# Patient Record
Sex: Female | Born: 1970 | Race: White | Hispanic: No | Marital: Single | Smoking: Light tobacco smoker
Health system: Southern US, Community
[De-identification: ages and names within clinical notes are randomized; demographics above are authoritative.]

## PROBLEM LIST (undated history)

## (undated) DIAGNOSIS — M1990 Unspecified osteoarthritis, unspecified site: Secondary | ICD-10-CM

## (undated) DIAGNOSIS — Z87898 Personal history of other specified conditions: Secondary | ICD-10-CM

## (undated) DIAGNOSIS — D569 Thalassemia, unspecified: Secondary | ICD-10-CM

## (undated) HISTORY — DX: Unspecified osteoarthritis, unspecified site: M19.90

## (undated) HISTORY — DX: Thalassemia, unspecified: D56.9

## (undated) HISTORY — DX: Personal history of other specified conditions: Z87.898

---

## 2007-08-18 ENCOUNTER — Emergency Department (HOSPITAL_COMMUNITY): Admission: EM | Admit: 2007-08-18 | Discharge: 2007-08-18 | Payer: Self-pay | Admitting: Emergency Medicine

## 2008-06-12 ENCOUNTER — Encounter (INDEPENDENT_AMBULATORY_CARE_PROVIDER_SITE_OTHER): Payer: Self-pay | Admitting: *Deleted

## 2008-07-07 ENCOUNTER — Ambulatory Visit: Payer: Self-pay | Admitting: Internal Medicine

## 2008-07-07 DIAGNOSIS — R519 Headache, unspecified: Secondary | ICD-10-CM | POA: Insufficient documentation

## 2008-07-07 DIAGNOSIS — R51 Headache: Secondary | ICD-10-CM

## 2008-07-10 ENCOUNTER — Telehealth (INDEPENDENT_AMBULATORY_CARE_PROVIDER_SITE_OTHER): Payer: Self-pay | Admitting: *Deleted

## 2008-07-10 LAB — CONVERTED CEMR LAB
BUN: 13 mg/dL (ref 6–23)
Basophils Relative: 0.1 % (ref 0.0–3.0)
Chloride: 107 meq/L (ref 96–112)
Creatinine, Ser: 0.6 mg/dL (ref 0.4–1.2)
Eosinophils Absolute: 0.2 10*3/uL (ref 0.0–0.7)
Eosinophils Relative: 2.5 % (ref 0.0–5.0)
GFR calc non Af Amer: 120 mL/min
Glucose, Bld: 73 mg/dL (ref 70–99)
HCT: 38.6 % (ref 36.0–46.0)
MCV: 64.8 fL — ABNORMAL LOW (ref 78.0–100.0)
Monocytes Absolute: 0.5 10*3/uL (ref 0.1–1.0)
Monocytes Relative: 7.9 % (ref 3.0–12.0)
Neutrophils Relative %: 66.7 % (ref 43.0–77.0)
RBC: 5.95 M/uL — ABNORMAL HIGH (ref 3.87–5.11)
TSH: 1.46 microintl units/mL (ref 0.35–5.50)
WBC: 6.5 10*3/uL (ref 4.5–10.5)

## 2009-10-09 ENCOUNTER — Ambulatory Visit: Payer: Self-pay | Admitting: Internal Medicine

## 2009-10-09 DIAGNOSIS — M1990 Unspecified osteoarthritis, unspecified site: Secondary | ICD-10-CM

## 2009-10-09 LAB — CONVERTED CEMR LAB
Eosinophils Absolute: 0.2 10*3/uL (ref 0.0–0.7)
HCT: 38.5 % (ref 36.0–46.0)
Lymphs Abs: 1.3 10*3/uL (ref 0.7–4.0)
MCHC: 30.8 g/dL (ref 30.0–36.0)
MCV: 64.5 fL — ABNORMAL LOW (ref 78.0–100.0)
Monocytes Absolute: 0.4 10*3/uL (ref 0.1–1.0)
Neutrophils Relative %: 71.3 % (ref 43.0–77.0)
Platelets: 275 10*3/uL (ref 150.0–400.0)
RDW: 17 % — ABNORMAL HIGH (ref 11.5–14.6)
TSH: 1.37 microintl units/mL (ref 0.35–5.50)

## 2009-10-15 ENCOUNTER — Encounter (INDEPENDENT_AMBULATORY_CARE_PROVIDER_SITE_OTHER): Payer: Self-pay | Admitting: *Deleted

## 2009-10-15 LAB — CONVERTED CEMR LAB
ALT: 13 units/L (ref 0–35)
AST: 12 units/L (ref 0–37)
CO2: 29 meq/L (ref 19–32)
Chloride: 108 meq/L (ref 96–112)
Cholesterol: 156 mg/dL (ref 0–200)
Creatinine, Ser: 0.7 mg/dL (ref 0.4–1.2)
HDL: 75 mg/dL (ref >39.00)
Potassium: 4.1 meq/L (ref 3.5–5.1)
Triglycerides: 114 mg/dL (ref 0.0–149.0)
VLDL: 22.8 mg/dL (ref 0.0–40.0)

## 2009-10-24 ENCOUNTER — Ambulatory Visit: Payer: Self-pay | Admitting: Internal Medicine

## 2009-10-26 ENCOUNTER — Encounter (INDEPENDENT_AMBULATORY_CARE_PROVIDER_SITE_OTHER): Payer: Self-pay | Admitting: *Deleted

## 2009-10-26 LAB — CONVERTED CEMR LAB: Fecal Occult Bld: NEGATIVE

## 2009-10-29 ENCOUNTER — Encounter: Admission: RE | Admit: 2009-10-29 | Discharge: 2010-01-27 | Payer: Self-pay | Admitting: Internal Medicine

## 2009-11-21 ENCOUNTER — Encounter: Payer: Self-pay | Admitting: Internal Medicine

## 2010-04-05 ENCOUNTER — Encounter: Payer: Self-pay | Admitting: Internal Medicine

## 2010-10-08 NOTE — Miscellaneous (Signed)
Summary: PT Discharge/[facility]  PT Discharge/[facility]   Imported By: Lanelle Bal 11/30/2009 13:49:44  _____________________________________________________________________  External Attachment:    Type:   Image     Comment:   External Document

## 2010-10-08 NOTE — Letter (Signed)
Summary: Proof of Exam Form/[facility]  Proof of Exam Form/[facility]   Imported By: Lanelle Bal 04/12/2010 09:14:12  _____________________________________________________________________  External Attachment:    Type:   Image     Comment:   External Document

## 2010-10-08 NOTE — Letter (Signed)
Summary: [facility]: Immunoassay Fecal Occult Blood (iFOB) Order Form  [facility] [place]  [address] [place], [place] [address]   Phone: [phone]  Fax: [phone]      [facility]: Immunoassay Fecal Occult Blood (iFOB) Order Form   October 15, 2009 MRN: [id number]   WESTLYNN FIFER 12-29-1970   Physicican Name:_______paz________________  Diagnosis Code:_________285.9_________________      Shary Decamp

## 2010-10-08 NOTE — Letter (Signed)
Summary: Results Follow up Letter  [facility] [place]  [address] [place], [place] [address]   Phone: [phone]  Fax: [phone]    10/26/2009 MRN: [id number]  Jessica Patrick [address] [place], [place]  [address]  Dear Ms. Picha,  The following are the results of your recent test(s):   Hemoccult:        Normal __x___  Not normal _______ Comments:    _______stool test was negative for blood ______________________________________________________________ Other Tests:    We routinely do not discuss normal results over the telephone.  If you desire a copy of the results, or you have any questions about this information we can discuss them at your next office visit.   Sincerely,

## 2010-10-08 NOTE — Assessment & Plan Note (Signed)
Summary: CHECK UP//PH   Vital Signs:  Patient profile:   40 year old female Height:      64.5 inches Weight:      294 pounds BMI:     49.87 Pulse rate:   78 / minute BP sitting:   120 / 82  Vitals Entered By: Dena Billet CC: CPX Comments PT. complains of left knee pain x when walking allergy/sinus symptoms   History of Present Illness: CPX here w/ mom  complains of left knee pain anteriorly  x 89mo when walking swelling--no redness, warm--no  mild allergy/sinus symptoms   Allergies: No Known Drug Allergies  Past History:  Past Medical History: G0 Headache  Past Surgical History: Reviewed history from 07/07/2008 and no changes required. none  Family History: Reviewed history from 07/07/2008 and no changes required. Diabetes- mother cholesterol-mother  Prostate CA 1st degree relative <50-father Hypertension-mother,father colon ca--no breast ca--GM (?)  Social History: Reviewed history from 07/07/2008 and no changes required. 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   Neck:  no masses and no thyromegaly.   Lungs:  normal respiratory effort, no intercostal retractions, no accessory muscle use, and normal breath sounds.   Heart:  normal rate, regular rhythm, and no murmur.   Abdomen:  soft, non-tender, no distention, and no masses.   Extremities:  no pretibial edema bilaterally  ++ varocse veins w/o evidence of complication  KNEES:  R normal  L  knee slightly  puffy anteriorly, otherwise normal  Psych:  Oriented X3, memory intact for recent and remote, not anxious appearing, and not depressed appearing.     Impression & Recommendations:  Problem # 1:  HEALTH SCREENING (ICD-V70.0) discussed diet and exercise once knee pain better  Td 2009 had a  flu shot   diet-exercise discussed  never had a PAP or MMG, was refered in 2009 but did not go--STRONGLY REC TO GO, RE-REFERAL   Orders: Venipuncture (40981) TLB-BMP (Basic Metabolic Panel-BMET) (80048-METABOL) TLB-ALT (SGPT) (84460-ALT) TLB-AST (SGOT) (84450-SGOT) TLB-Lipid Panel (80061-LIPID) TLB-CBC Platelet - w/Differential (85025-CBCD) TLB-TSH (Thyroid Stimulating Hormone) (19147-WGN) Gynecologic Referral (Gyn)  Problem # 2:  KNEE PAIN (ICD-719.46)  see instructions  PT referal , close to her house   Orders: Physical Therapy Referral (PT)  Patient Instructions: 1)  Take 400-600 mg of Ibuprofen (Advil, Motrin) with food every  6 hours as needed  for relief of pain , stop meds if you have stomach pain  2)  Please schedule a follow-up appointment in 1 year.    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

## 2011-12-24 ENCOUNTER — Ambulatory Visit (INDEPENDENT_AMBULATORY_CARE_PROVIDER_SITE_OTHER): Payer: Managed Care, Other (non HMO) | Admitting: Internal Medicine

## 2011-12-24 ENCOUNTER — Encounter: Payer: Self-pay | Admitting: Internal Medicine

## 2011-12-24 DIAGNOSIS — M25569 Pain in unspecified knee: Secondary | ICD-10-CM

## 2011-12-24 DIAGNOSIS — Z0000 Encounter for general adult medical examination without abnormal findings: Secondary | ICD-10-CM

## 2011-12-24 NOTE — Assessment & Plan Note (Signed)
Left knee and hip pain. Knee pain or radiculopathy? Refer to ortho

## 2011-12-24 NOTE — Assessment & Plan Note (Addendum)
Td 2009 Unable to exercise due to pain Diet has improved significantly. Recommend to stay in that direction She has been recommended Pap smears and mammograms in the past, has been reluctant, states that she eventually did have a Pap smear, no documentation. She also states that she had mammogram, no documentation Plan: Refer to gynecology Refer for mammogram--------------> mom states she will schedule it  Labs

## 2011-12-24 NOTE — Progress Notes (Signed)
  Subjective:    Patient ID: Jessica Patrick, female    DOB: Sep 10, 1970, 41 y.o.   MRN: [id number]  HPI CPX Here  with her mother. Reports a much better diet in the last few months, has lost 20 pounds. She continue with left knee pain, the pain actually radiates up to the left hip, described as severe at times and worse when she tries to get up from a chair. Denies any bladder or bowel incontinence, no tingling in the lower extremities and no actual axial pain. She takes it sporadically Tylenol or over-the-counter Aleve.  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  Respiratory: Negative for cough and shortness of breath.   Cardiovascular: Negative for chest pain and leg swelling.  Gastrointestinal: Negative for abdominal pain, blood in stool and abdominal distention.  Genitourinary: Negative for hematuria and difficulty urinating.   periods are regular, last 3 days, described as very crampy     Objective:   Physical Exam General -- alert, well-developed, and overweight appearing ;NAD  Neck --no LADs, no thyromegaly  Lungs -- normal respiratory effort, no intercostal retractions, no accessory muscle use, and normal breath sounds.   Heart-- normal rate, regular rhythm, no murmur, and no gallop.   Abdomen--declined to get on the table d/t knee pain. Unable to do an abd exam  Extremities--  +/+++ pretibial edema bilaterally, symmetric; knees are without deformities. Musculoskeletal-- no tender to palpation in the lower back. Neurological exam--gait antalgic due to  knee and hip pain, motor symmetric. Unable to test for  DTRs    Assessment & Plan:

## 2011-12-24 NOTE — Patient Instructions (Addendum)
Come back fasting: FLP CBC, CMP, TSH.---- dx V70 ---- We are referring you to a  gynecologist and for a mammogram. Is extremely important to keep the appointments.

## 2011-12-25 ENCOUNTER — Other Ambulatory Visit (INDEPENDENT_AMBULATORY_CARE_PROVIDER_SITE_OTHER): Payer: Managed Care, Other (non HMO)

## 2011-12-25 DIAGNOSIS — D649 Anemia, unspecified: Secondary | ICD-10-CM

## 2011-12-25 DIAGNOSIS — Z0000 Encounter for general adult medical examination without abnormal findings: Secondary | ICD-10-CM

## 2011-12-25 LAB — COMPREHENSIVE METABOLIC PANEL
ALT: 11 U/L (ref 0–35)
AST: 14 U/L (ref 0–37)
Albumin: 4.1 g/dL (ref 3.5–5.2)
BUN: 16 mg/dL (ref 6–23)
CO2: 26 mEq/L (ref 19–32)
Calcium: 9 mg/dL (ref 8.4–10.5)
Chloride: 108 mEq/L (ref 96–112)
Creatinine, Ser: 0.7 mg/dL (ref 0.4–1.2)
GFR: 96.56 mL/min (ref >60.00)
Potassium: 4.1 mEq/L (ref 3.5–5.1)

## 2011-12-25 LAB — LIPID PANEL
LDL Cholesterol: 63 mg/dL (ref 0–99)
Total CHOL/HDL Ratio: 2
VLDL: 15 mg/dL (ref 0.0–40.0)

## 2011-12-25 LAB — CBC WITH DIFFERENTIAL/PLATELET
Basophils Relative: 0.4 % (ref 0.0–3.0)
Hemoglobin: 11.8 g/dL — ABNORMAL LOW (ref 12.0–15.0)
Lymphocytes Relative: 20 % (ref 12.0–46.0)
Monocytes Relative: 6.5 % (ref 3.0–12.0)
Neutro Abs: 4.7 10*3/uL (ref 1.4–7.7)
Neutrophils Relative %: 70.5 % (ref 43.0–77.0)
RBC: 5.91 Mil/uL — ABNORMAL HIGH (ref 3.87–5.11)
WBC: 6.7 10*3/uL (ref 4.5–10.5)

## 2012-01-02 ENCOUNTER — Telehealth: Payer: Self-pay | Admitting: Hematology & Oncology

## 2012-01-02 NOTE — Telephone Encounter (Signed)
Left pt message to call and schedule appointment °

## 2012-01-13 ENCOUNTER — Other Ambulatory Visit: Payer: Self-pay | Admitting: Internal Medicine

## 2012-01-13 DIAGNOSIS — Z1231 Encounter for screening mammogram for malignant neoplasm of breast: Secondary | ICD-10-CM

## 2012-01-29 ENCOUNTER — Other Ambulatory Visit (HOSPITAL_BASED_OUTPATIENT_CLINIC_OR_DEPARTMENT_OTHER): Payer: Managed Care, Other (non HMO) | Admitting: Lab

## 2012-01-29 ENCOUNTER — Ambulatory Visit: Payer: Managed Care, Other (non HMO)

## 2012-01-29 ENCOUNTER — Ambulatory Visit (HOSPITAL_BASED_OUTPATIENT_CLINIC_OR_DEPARTMENT_OTHER): Payer: Managed Care, Other (non HMO) | Admitting: Hematology & Oncology

## 2012-01-29 VITALS — BP 127/72 | HR 98 | Temp 96.9°F | Ht 64.0 in | Wt 277.0 lb

## 2012-01-29 DIAGNOSIS — D649 Anemia, unspecified: Secondary | ICD-10-CM

## 2012-01-29 DIAGNOSIS — D509 Iron deficiency anemia, unspecified: Secondary | ICD-10-CM

## 2012-01-29 LAB — CBC WITH DIFFERENTIAL (CANCER CENTER ONLY)
Eosinophils Absolute: 0.2 10*3/uL (ref 0.0–0.5)
LYMPH%: 21.8 % (ref 14.0–48.0)
MCH: 20 pg — ABNORMAL LOW (ref 26.0–34.0)
MCV: 62 fL — ABNORMAL LOW (ref 81–101)
MONO%: 7 % (ref 0.0–13.0)
Platelets: 253 10*3/uL (ref 145–400)
RBC: 5.85 10*6/uL — ABNORMAL HIGH (ref 3.70–5.32)
RDW: 18 % — ABNORMAL HIGH (ref 11.1–15.7)

## 2012-01-29 LAB — CHCC SATELLITE - SMEAR

## 2012-01-29 NOTE — Progress Notes (Signed)
This office note has been dictated.

## 2012-01-30 NOTE — Progress Notes (Signed)
CC:   Willow Ora, MD Feliberto Gottron. Turner Daniels, M.D.  DIAGNOSIS:  Microcytic anemia.  HISTORY OF PRESENT ILLNESS:  Ms. Klostermann is a really nice 41 year old white female.  She lives in [place].  She lives with her mom.  She is originally from [place]. She has been down in the [place] area now for a good 7 years.  She is seen by Dr. Willow Ora.  She is going to have knee surgery for her left knee June 7.  She is going to be operated on by Dr. Gean Birchwood.  She had some lab work done recently.  She had a CBC showed which showed a white count 6.7, hemoglobin 11.8, hematocrit 38.5, platelet count is 214.  MCV was quite low at 65.  Going back to 2010, hemoglobin 12.3, hematocrit 38.6.  MCV was 65. She has a normal white cell differential.  She still has her monthly cycles.  She stated that they are not that heavy. She has never been pregnant.  She said there has been no bleeding otherwise.  She was told she had very low iron.  Unfortunately, I do not have any iron studies that were run on her.  She had a TSH was 1.72.  Her electrolytes all looked okay.  BUN and creatinine were 16 and 0.7. Calcium was 9.  Albumin was 4.1.  Again, she was kindly referred to the [facility] for evaluation of this microcytic anemia.  She denies any "cravings." She does not chew ice.  She is not a vegetarian.  She works over at [employer].  She has had no rashes.  She has had no weight loss or weight gain.  There has been no change in her medications.  PAST MEDICAL HISTORY: 1. Left knee arthritis. 2. Intermittent headaches.  ALLERGIES:  None.  CURRENT MEDICATIONS:  Advil as indicated.  SOCIAL HISTORY:  Negative for tobacco or alcohol use.  She has no obvious occupational exposures.  FAMILY HISTORY:  Remarkable as her mother had early stage colon cancer. Her father had early stage prostate cancer.  REVIEW OF SYSTEMS:  As stated in history of present illness.  No additional findings are  noted on 12 system review.  PHYSICAL EXAMINATION:  This is a moderately obese white female in no obvious distress.  Vital signs:  Temperature 96.9, pulse 90, respiratory rate 24, blood pressure 127/72.  Weight is 277.  Head and neck: Normocephalic, atraumatic skull.  There are no ocular or oral lesions. There are no palpable cervical or supraclavicular lymph nodes.  Lungs: Clear bilaterally.  Cardiac:  Regular rate and rhythm with a normal S1 and S2.  There are no murmurs, rubs or bruits.  Abdomen: Soft with good bowel sounds.  She is somewhat obese.  She has no fluid wave.  There is no guarding or rebound tenderness.  There is no palpable hepatosplenomegaly.  Extremities:  Some chronic 1+ nonpitting edema of the lower legs.  She has good range motion of her joints.  There is no joint erythema, warmth or swelling.  Skin: No rashes, ecchymosis or petechia.  Neurological:  No focal neurological deficits.  LABORATORY STUDIES:  White cell count is 6.3, hemoglobin 11.7, hematocrit 36.3 and platelet count 253.  MCV is 62.  RDW is 18.  Her blood smear shows mild anisocytosis.  She does have some target cells.  There are some microcytic red cells.  She has some hypochromic red cells.  There are no nucleated red blood cells. I  see no teardrop cells.  There are no spherocytes.  White cells appear normal in morphology and maturation.  She has no hypersegmented polys.  There are no atypical lymphocytes.  Platelets are adequate in number and size.  IMPRESSION:  Ms. Capps is a very nice 41 year old white female with microcytic anemia.  In reality, she is really not even anemic.  I highly suspect that she has thalassemia.  Her mother has thalassemia.  Her mother's parents had thalassemia.  I suspect that she may also have some degree of iron deficiency. However, I think the majority of the low MCV is related to thalassemia.  She is going to have upcoming knee surgery.  This is arthroscopic.   I want to make sure that we get her blood optimized before surgery.  I will call her with results of the iron.  If she is iron deficient, then we will give her IV iron, which should be adequate to get her through surgery.  I do not see a need for a bone marrow biopsy.  I do not see any indication for any hematologic malignancy.  I will likely plan for Ms Lunz to come back to see [place] in about 2 months or so.  I want to make sure that when she comes back to see [place] she will be able to get here with her knee improving.  I spent a good hour and a half with Ms. Capitano.  She and her mom are both very, very nice.    ______________________________ Josph Macho, M.D. PRE/MEDQ  D:  01/29/2012  T:  01/30/2012  Job:  [id number]

## 2012-02-03 LAB — ERYTHROPOIETIN: Erythropoietin: 19.7 m[IU]/mL (ref 2.6–34.0)

## 2012-02-03 LAB — COMPREHENSIVE METABOLIC PANEL
ALT: 9 U/L (ref 0–35)
BUN: 18 mg/dL (ref 6–23)
CO2: 28 mEq/L (ref 19–32)
Creatinine, Ser: 0.69 mg/dL (ref 0.50–1.10)
Glucose, Bld: 78 mg/dL (ref 70–99)
Total Bilirubin: 0.8 mg/dL (ref 0.3–1.2)

## 2012-02-03 LAB — HEMOGLOBINOPATHY EVALUATION
Hgb F Quant: 0 % (ref 0.0–2.0)
Hgb S Quant: 0 %

## 2012-02-03 LAB — RETICULOCYTES (CHCC)
ABS Retic: 47.5 10*3/uL (ref 19.0–186.0)
Retic Ct Pct: 0.8 % (ref 0.4–2.3)

## 2012-02-03 LAB — IRON AND TIBC: %SAT: 13 % — ABNORMAL LOW (ref 20–55)

## 2012-02-07 HISTORY — PX: KNEE SURGERY: SHX244

## 2012-02-25 ENCOUNTER — Ambulatory Visit
Admission: RE | Admit: 2012-02-25 | Discharge: 2012-02-25 | Disposition: A | Discharge status: Home / Self Care | Payer: Managed Care, Other (non HMO) | Source: Ambulatory Visit | Attending: Internal Medicine | Admitting: Internal Medicine

## 2012-02-25 DIAGNOSIS — Z1231 Encounter for screening mammogram for malignant neoplasm of breast: Secondary | ICD-10-CM

## 2012-03-04 ENCOUNTER — Other Ambulatory Visit: Payer: Self-pay | Admitting: Hematology & Oncology

## 2012-03-04 ENCOUNTER — Telehealth: Payer: Self-pay | Admitting: Hematology & Oncology

## 2012-03-04 DIAGNOSIS — D509 Iron deficiency anemia, unspecified: Secondary | ICD-10-CM

## 2012-03-04 NOTE — Telephone Encounter (Signed)
Mailed august schedule °

## 2012-05-03 ENCOUNTER — Other Ambulatory Visit: Payer: Managed Care, Other (non HMO) | Admitting: Lab

## 2012-05-03 ENCOUNTER — Ambulatory Visit: Payer: Managed Care, Other (non HMO) | Admitting: Hematology & Oncology

## 2012-05-03 ENCOUNTER — Telehealth: Payer: Self-pay | Admitting: Hematology & Oncology

## 2012-05-03 NOTE — Telephone Encounter (Signed)
Per Mother pt canceled 8-26 appointment on the phone tree. She said patient would call back to reschedule

## 2012-12-29 ENCOUNTER — Encounter: Payer: Managed Care, Other (non HMO) | Admitting: Internal Medicine

## 2013-01-05 ENCOUNTER — Encounter: Payer: Self-pay | Admitting: Internal Medicine

## 2013-01-05 ENCOUNTER — Ambulatory Visit (INDEPENDENT_AMBULATORY_CARE_PROVIDER_SITE_OTHER): Payer: Managed Care, Other (non HMO) | Admitting: Internal Medicine

## 2013-01-05 VITALS — BP 126/82 | HR 78 | Ht 63.5 in | Wt 258.0 lb

## 2013-01-05 DIAGNOSIS — M25562 Pain in left knee: Secondary | ICD-10-CM

## 2013-01-05 DIAGNOSIS — Z0000 Encounter for general adult medical examination without abnormal findings: Secondary | ICD-10-CM

## 2013-01-05 LAB — COMPREHENSIVE METABOLIC PANEL
BUN: 22 mg/dL (ref 6–23)
CO2: 29 mEq/L (ref 19–32)
Calcium: 9.4 mg/dL (ref 8.4–10.5)
Chloride: 104 mEq/L (ref 96–112)
Creatinine, Ser: 0.7 mg/dL (ref 0.4–1.2)
GFR: 104.52 mL/min (ref >60.00)
Glucose, Bld: 95 mg/dL (ref 70–99)
Total Bilirubin: 1.2 mg/dL (ref 0.3–1.2)

## 2013-01-05 LAB — CBC WITH DIFFERENTIAL/PLATELET
Basophils Absolute: 0 10*3/uL (ref 0.0–0.1)
Eosinophils Relative: 2 % (ref 0.0–5.0)
HCT: 40.1 % (ref 36.0–46.0)
Hemoglobin: 12.9 g/dL (ref 12.0–15.0)
Lymphocytes Relative: 20.3 % (ref 12.0–46.0)
Lymphs Abs: 1.5 10*3/uL (ref 0.7–4.0)
Monocytes Relative: 6.7 % (ref 3.0–12.0)
Neutro Abs: 5.3 10*3/uL (ref 1.4–7.7)
WBC: 7.4 10*3/uL (ref 4.5–10.5)

## 2013-01-05 LAB — LIPID PANEL
Cholesterol: 166 mg/dL (ref 0–200)
HDL: 87.7 mg/dL (ref >39.00)
LDL Cholesterol: 65 mg/dL (ref 0–99)
VLDL: 13.4 mg/dL (ref 0.0–40.0)

## 2013-01-05 LAB — TSH: TSH: 0.87 u[IU]/mL (ref 0.35–5.50)

## 2013-01-05 NOTE — Patient Instructions (Addendum)
Please see your female doctor yearly. You are due for a mammogram next month, please arrange your appointment. Next visit with me  one year. If you continue with pain and swelling, please see the orthopedic doctor.

## 2013-01-05 NOTE — Progress Notes (Signed)
  Subjective:    Patient ID: Jessica Patrick, female    DOB: [birth year], 42 y.o.   MRN: [id number]  HPI CPX   Past Medical History  Diagnosis Date  . H/O headache   . Thalassemia      s/p eval hematology 01-2012)   Past Surgical History  Procedure Laterality Date  . 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 Denies chest pain or shortness or breath. No nausea, vomiting, diarrhea or blood in the stools. Continue with L leg pain and swelling. on as needed NSAIDs. No anxiety or depression.    Objective:   Physical Exam BP 126/82  Pulse 78  Ht 5' 3.5" (1.613 m)  Wt 258 lb (117.028 kg)  BMI 44.98 kg/m2  SpO2 99% \ General -- alert, well-developed, has lost weight ! Neck --no thyromegaly Lungs -- normal respiratory effort, no intercostal retractions, no accessory muscle use, and normal breath sounds.   Heart-- normal rate, regular rhythm, no murmur, and no gallop.   Abdomen--unable to exam, declined to get in the examining table   Extremities-- ++/+++ pitting edema below L knee (since surgery 2013)  Psych-- Cognition and judgment appear intact. Alert and cooperative with normal attention span and concentration.  not anxious appearing and not depressed appearing.       Assessment & Plan:

## 2013-01-05 NOTE — Assessment & Plan Note (Signed)
Td 2009 Unable to exercise due to pain Diet has improved significantly, she continue loosing weight, praised! Pt and her mother report she had a female exam last year, no documentation. rec to see gyn yearly 01-2012 mammograms (-) Labs

## 2013-01-05 NOTE — Assessment & Plan Note (Signed)
On going swelling, pain R leg, take NSAIDs, rec to see ortho

## 2013-01-06 ENCOUNTER — Encounter: Payer: Self-pay | Admitting: *Deleted

## 2013-01-17 ENCOUNTER — Other Ambulatory Visit: Payer: Self-pay | Admitting: Internal Medicine

## 2013-01-17 DIAGNOSIS — Z1231 Encounter for screening mammogram for malignant neoplasm of breast: Secondary | ICD-10-CM

## 2013-03-02 ENCOUNTER — Ambulatory Visit
Admission: RE | Admit: 2013-03-02 | Discharge: 2013-03-02 | Disposition: A | Discharge status: Home / Self Care | Payer: Managed Care, Other (non HMO) | Source: Ambulatory Visit | Attending: Internal Medicine | Admitting: Internal Medicine

## 2013-03-02 DIAGNOSIS — Z1231 Encounter for screening mammogram for malignant neoplasm of breast: Secondary | ICD-10-CM

## 2014-01-10 ENCOUNTER — Telehealth: Payer: Self-pay

## 2014-01-10 NOTE — Telephone Encounter (Signed)
Medication and allergies:  Reviewed and updated  90 day supply/mail order: na Local pharmacy: [facility]   Immunizations due:  Flu in the fall  A/P:   No changes to FH, PSH or Personal Hx Flu vaccine-- Tdap--2009 Pap--no records though states had in 2013 MMG--02/2013--neg bi rads  To Discuss with Provider: Not at this time Will have hip replacement surgery soon

## 2014-01-11 ENCOUNTER — Encounter: Payer: Self-pay | Admitting: Internal Medicine

## 2014-01-11 ENCOUNTER — Ambulatory Visit (INDEPENDENT_AMBULATORY_CARE_PROVIDER_SITE_OTHER): Payer: Managed Care, Other (non HMO) | Admitting: Internal Medicine

## 2014-01-11 VITALS — BP 109/70 | HR 75 | Temp 97.8°F | Ht 63.5 in | Wt 248.0 lb

## 2014-01-11 DIAGNOSIS — M1990 Unspecified osteoarthritis, unspecified site: Secondary | ICD-10-CM

## 2014-01-11 DIAGNOSIS — Z0000 Encounter for general adult medical examination without abnormal findings: Secondary | ICD-10-CM

## 2014-01-11 LAB — CBC WITH DIFFERENTIAL/PLATELET
BASOS ABS: 0 10*3/uL (ref 0.0–0.1)
Basophils Relative: 0 % (ref 0.0–3.0)
EOS ABS: 0.2 10*3/uL (ref 0.0–0.7)
Eosinophils Relative: 2 % (ref 0.0–5.0)
HEMATOCRIT: 37.5 % (ref 36.0–46.0)
Hemoglobin: 11.7 g/dL — ABNORMAL LOW (ref 12.0–15.0)
LYMPHS ABS: 1.3 10*3/uL (ref 0.7–4.0)
Lymphocytes Relative: 14.9 % (ref 12.0–46.0)
MCHC: 31.3 g/dL (ref 30.0–36.0)
MCV: 65.5 fl — ABNORMAL LOW (ref 78.0–100.0)
MONOS PCT: 6 % (ref 3.0–12.0)
Monocytes Absolute: 0.5 10*3/uL (ref 0.1–1.0)
Neutro Abs: 6.7 10*3/uL (ref 1.4–7.7)
Neutrophils Relative %: 77.1 % — ABNORMAL HIGH (ref 43.0–77.0)
PLATELETS: 233 10*3/uL (ref 150.0–400.0)
RBC: 5.72 Mil/uL — ABNORMAL HIGH (ref 3.87–5.11)
RDW: 16.9 % — AB (ref 11.5–15.5)
WBC: 8.7 10*3/uL (ref 4.0–10.5)

## 2014-01-11 LAB — LIPID PANEL
CHOL/HDL RATIO: 2
Cholesterol: 150 mg/dL (ref 0–200)
HDL: 79.5 mg/dL (ref >39.00)
LDL Cholesterol: 56 mg/dL (ref 0–99)
Triglycerides: 72 mg/dL (ref 0.0–149.0)
VLDL: 14.4 mg/dL (ref 0.0–40.0)

## 2014-01-11 LAB — TSH: TSH: 0.73 u[IU]/mL (ref 0.35–4.50)

## 2014-01-11 LAB — COMPREHENSIVE METABOLIC PANEL
ALT: 12 U/L (ref 0–35)
AST: 13 U/L (ref 0–37)
Albumin: 4.1 g/dL (ref 3.5–5.2)
Alkaline Phosphatase: 53 U/L (ref 39–117)
BILIRUBIN TOTAL: 1 mg/dL (ref 0.2–1.2)
BUN: 20 mg/dL (ref 6–23)
CALCIUM: 9.5 mg/dL (ref 8.4–10.5)
CHLORIDE: 106 meq/L (ref 96–112)
CO2: 27 meq/L (ref 19–32)
Creatinine, Ser: 0.7 mg/dL (ref 0.4–1.2)
GFR: 100.49 mL/min (ref >60.00)
Glucose, Bld: 81 mg/dL (ref 70–99)
Potassium: 3.8 mEq/L (ref 3.5–5.1)
SODIUM: 140 meq/L (ref 135–145)
TOTAL PROTEIN: 6.6 g/dL (ref 6.0–8.3)

## 2014-01-11 NOTE — Assessment & Plan Note (Signed)
Severe left hip DJD, in need of surgery. Unable to do at EKG today because she was unable to get on the exam  table Cardiovascular exam normal except for low extremity edema which could be from obesity-inactivity. She snores but the Epworth scale  is only 2 which is negative screening for sleep apnea. Further management per ortho

## 2014-01-11 NOTE — Progress Notes (Signed)
   Subjective:    Patient ID: Jessica BellowDana Searfoss, female    DOB: 07/11/1971, 43 y.o.   MRN: [id number]  DOS:  01/11/2014 Type of  visit:  CPX, here w/ her mother   ROS Diet-- improved Exercise-- limited  No  CP, SOB No palpitations, + lower extremity edema B, symmetric, at baseline No orthopnea , DOE Denies  nausea, vomiting diarrhea Denies  blood in the stools (-) cough, sputum production (-) wheezing, chest congestion No dysuria, gross hematuria, difficulty urinating  No anxiety, depression    Past Medical History  Diagnosis Date  . H/O headache   . Thalassemia      s/p eval hematology 01-2012)  . DJD (degenerative joint disease)     Past Surgical History  Procedure Laterality Date  . Knee surgery Left 6-13    knee scope    History   Social History  . Marital Status: Single    Spouse Name: N/A    Number of Children: 0  . 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 Smoking status: Light Tobacco Smoker  . Smokeless tobacco: Never Used  . Alcohol Use: Yes     Comment: socially   . Drug Use: No  . Sexual Activity: Not on file   Other Topics Concern  . Not on file   Social History Narrative   Lives w/ parents, has a high school diploma     Family History  Problem Relation Age of Onset  . Diabetes Mother   . Hypertension Mother   . Hypertension Father   . Hyperlipidemia Mother   . Colon cancer Mother [id number]  . Breast cancer Neg Hx   . CAD Neg Hx        Medication List       This list is accurate as of: 01/11/14  6:17 PM.  Always use your most recent med list.               FOLIC ACID PO  Take 1 tablet by mouth daily.     MELOXICAM PO  Take 1 tablet by mouth daily.     MULTIPLE VITAMIN PO  Take by mouth every morning.           Objective:   Physical Exam BP 109/70  Pulse 75  Temp(Src) 97.8 F (36.6 C)  Ht 5' 3.5" (1.613 m)  Wt 248 lb (112.492 kg)  BMI 43.24 kg/m2  SpO2 98% Unable to  get on the exam table, exam done w/ pt sitting General -- alert, well-developed, NAD.  Neck --no thyromegaly  HEENT-- Not pale.   Lungs -- normal respiratory effort, no intercostal retractions, no accessory muscle use, and normal breath sounds.  Heart-- normal rate, regular rhythm, no murmur.  Abdomen-- Not distended, non-tender.  Extremities-- +/+++  pretibial edema bilaterally symmetric; gait limited by pain.  Neurologic--  alert & oriented X3. Speech normal, strength normal in all extremities.   Psych-- Cognition and judgment appear intact. Cooperative with normal attention span and concentration. No anxious or depressed appearing.          Assessment & Plan:

## 2014-01-11 NOTE — Progress Notes (Signed)
Pre visit review using our clinic review tool, if applicable. No additional management support is needed unless otherwise documented below in the visit note. 

## 2014-01-11 NOTE — Patient Instructions (Signed)
Get your blood work before you leave   See your gynecologist yearly  Next visit is for a physical exam in 1 year, fasting Please make an appointment

## 2014-01-11 NOTE — Assessment & Plan Note (Addendum)
Td 2009 Unable to exercise due to pain Diet has improved   loosing weight in preparation to hip surgery Pt and her mother report she had a female exam ~ 2013,  no documentation -----> rec to see gyn yearly 02-2013  (-) mammograms   Labs

## 2014-01-12 ENCOUNTER — Telehealth: Payer: Self-pay | Admitting: Internal Medicine

## 2014-01-12 NOTE — Telephone Encounter (Signed)
Relevant patient education assigned to patient using Emmi. ° °

## 2014-01-13 ENCOUNTER — Encounter: Payer: Self-pay | Admitting: *Deleted

## 2014-02-02 ENCOUNTER — Telehealth: Payer: Self-pay | Admitting: *Deleted

## 2014-02-02 NOTE — Telephone Encounter (Signed)
Patient walked into office and dropped off Physician Result Form for her job/insurance. Formed filled out and placed in folder for Dr. Drue Novel. JG//CMA

## 2014-02-03 NOTE — Telephone Encounter (Signed)
Received completed and signed form from Dr. Drue Novel.  Form faxed to [facility] @ [phone]).  Confirmation received.//AB/CMA

## 2014-03-28 ENCOUNTER — Telehealth: Payer: Self-pay

## 2014-03-28 ENCOUNTER — Other Ambulatory Visit: Payer: Self-pay | Admitting: Orthopedic Surgery

## 2014-03-28 NOTE — Telephone Encounter (Signed)
Pt's family member called back and stated that she had a scheduling conflict and will not be able to make the appointment at 11:30 on 04/10/14.  Spoke with Dr. Drue NovelPaz and he ok'd opening 4:15 slot on 04/10/14 and stated to schedule patient for the 4 pm appointment.  Appointment scheduled.

## 2014-03-28 NOTE — Telephone Encounter (Signed)
Pt scheduled for surgery on August 5.  Surgical clearance appointment scheduled for 04/10/14 @ 11:30 am.

## 2014-03-30 ENCOUNTER — Encounter (HOSPITAL_COMMUNITY): Payer: Self-pay | Admitting: Pharmacy Technician

## 2014-04-06 ENCOUNTER — Encounter (HOSPITAL_COMMUNITY)
Admission: RE | Admit: 2014-04-06 | Discharge: 2014-04-06 | Disposition: A | Discharge status: Home / Self Care | Payer: Managed Care, Other (non HMO) | Source: Ambulatory Visit | Attending: Orthopedic Surgery | Admitting: Orthopedic Surgery

## 2014-04-06 DIAGNOSIS — Z01810 Encounter for preprocedural cardiovascular examination: Secondary | ICD-10-CM | POA: Insufficient documentation

## 2014-04-06 DIAGNOSIS — Z01812 Encounter for preprocedural laboratory examination: Secondary | ICD-10-CM | POA: Insufficient documentation

## 2014-04-06 DIAGNOSIS — Z01818 Encounter for other preprocedural examination: Secondary | ICD-10-CM | POA: Insufficient documentation

## 2014-04-06 LAB — CBC WITH DIFFERENTIAL/PLATELET
BASOS ABS: 0 10*3/uL (ref 0.0–0.1)
Basophils Relative: 0 % (ref 0–1)
EOS ABS: 0.2 10*3/uL (ref 0.0–0.7)
Eosinophils Relative: 3 % (ref 0–5)
HCT: 39.5 % (ref 36.0–46.0)
Hemoglobin: 12.5 g/dL (ref 12.0–15.0)
LYMPHS ABS: 1.4 10*3/uL (ref 0.7–4.0)
Lymphocytes Relative: 22 % (ref 12–46)
MCH: 20.6 pg — ABNORMAL LOW (ref 26.0–34.0)
MCHC: 31.6 g/dL (ref 30.0–36.0)
MCV: 65 fL — ABNORMAL LOW (ref 78.0–100.0)
MONOS PCT: 8 % (ref 3–12)
Monocytes Absolute: 0.5 10*3/uL (ref 0.1–1.0)
Neutro Abs: 4.3 10*3/uL (ref 1.7–7.7)
Neutrophils Relative %: 67 % (ref 43–77)
PLATELETS: 197 10*3/uL (ref 150–400)
RBC: 6.08 MIL/uL — ABNORMAL HIGH (ref 3.87–5.11)
RDW: 16.6 % — ABNORMAL HIGH (ref 11.5–15.5)
WBC: 6.4 10*3/uL (ref 4.0–10.5)

## 2014-04-06 LAB — URINALYSIS, ROUTINE W REFLEX MICROSCOPIC
Bilirubin Urine: NEGATIVE
GLUCOSE, UA: NEGATIVE mg/dL
Ketones, ur: NEGATIVE mg/dL
LEUKOCYTES UA: NEGATIVE
Nitrite: NEGATIVE
Protein, ur: NEGATIVE mg/dL
SPECIFIC GRAVITY, URINE: 1.022 (ref 1.005–1.030)
Urobilinogen, UA: 0.2 mg/dL (ref 0.0–1.0)
pH: 5.5 (ref 5.0–8.0)

## 2014-04-06 LAB — SURGICAL PCR SCREEN
MRSA, PCR: NEGATIVE
Staphylococcus aureus: NEGATIVE

## 2014-04-06 LAB — PROTIME-INR
INR: 1.04 (ref 0.00–1.49)
Prothrombin Time: 13.6 seconds (ref 11.6–15.2)

## 2014-04-06 LAB — TYPE AND SCREEN
ABO/RH(D): A POS
Antibody Screen: NEGATIVE

## 2014-04-06 LAB — BASIC METABOLIC PANEL
ANION GAP: 11 (ref 5–15)
BUN: 19 mg/dL (ref 6–23)
CALCIUM: 9.2 mg/dL (ref 8.4–10.5)
CO2: 26 mEq/L (ref 19–32)
Chloride: 105 mEq/L (ref 96–112)
Creatinine, Ser: 0.71 mg/dL (ref 0.50–1.10)
GFR calc Af Amer: >90 mL/min (ref >90)
GFR calc non Af Amer: >90 mL/min (ref >90)
GLUCOSE: 94 mg/dL (ref 70–99)
Potassium: 4.4 mEq/L (ref 3.7–5.3)
SODIUM: 142 meq/L (ref 137–147)

## 2014-04-06 LAB — URINE MICROSCOPIC-ADD ON

## 2014-04-06 LAB — ABO/RH: ABO/RH(D): A POS

## 2014-04-06 LAB — APTT: APTT: 29 s (ref 24–37)

## 2014-04-06 MED ORDER — CHLORHEXIDINE GLUCONATE 4 % EX LIQD: 60.0000 mL | Freq: Once | CUTANEOUS | Status: DC

## 2014-04-06 NOTE — Pre-Procedure Instructions (Signed)
Jessica Patrick  04/06/2014   Your procedure is scheduled on:  April 12, 2014  Report to [facility] Admitting at 12:45 PM.  Call this number if you have problems the morning of surgery: [phone]   Remember:   Do not eat food or drink liquids after midnight (August 4)   Take these medicines the morning of surgery with A SIP OF WATER: NONE   STOP MELOXICAM AS OF TODAY   Do not wear jewelry, make-up or nail polish.  Do not wear lotions, powders, or perfumes. You may wear deodorant.  Do not shave 48 hours prior to surgery.   Do not bring valuables to the hospital.  [facility] is not responsible for any belongings or valuables.               Contacts, dentures or bridgework may not be worn into surgery.  Leave suitcase in the car. After surgery it may be brought to your room.  For patients admitted to the hospital, discharge time is determined by your  treatment team.               Patients discharged the day of surgery will not be allowed to drive home.  Name and phone number of your driver:      Please read over the following fact sheets that you were given: Pain Booklet, Coughing and Deep Breathing, Blood Transfusion Information and Surgical Site Infection Prevention

## 2014-04-10 ENCOUNTER — Ambulatory Visit (INDEPENDENT_AMBULATORY_CARE_PROVIDER_SITE_OTHER): Payer: Managed Care, Other (non HMO) | Admitting: Internal Medicine

## 2014-04-10 ENCOUNTER — Encounter: Payer: Self-pay | Admitting: Internal Medicine

## 2014-04-10 ENCOUNTER — Ambulatory Visit: Payer: Managed Care, Other (non HMO) | Admitting: Internal Medicine

## 2014-04-10 ENCOUNTER — Telehealth: Payer: Self-pay | Admitting: *Deleted

## 2014-04-10 VITALS — BP 108/72 | HR 81 | Temp 97.9°F | Wt 244.0 lb

## 2014-04-10 DIAGNOSIS — M1611 Unilateral primary osteoarthritis, right hip: Secondary | ICD-10-CM

## 2014-04-10 DIAGNOSIS — M1610 Unilateral primary osteoarthritis, unspecified hip: Secondary | ICD-10-CM

## 2014-04-10 NOTE — Progress Notes (Signed)
Pre visit review using our clinic review tool, if applicable. No additional management support is needed unless otherwise documented below in the visit note. 

## 2014-04-10 NOTE — Patient Instructions (Signed)
You are clear for surgery, will fax the paperwork to your orthopedic surgery doctor

## 2014-04-10 NOTE — Progress Notes (Signed)
   Subjective:    Patient ID: Jessica Patrick, female    DOB: 05/29/1971, 43 y.o.   MRN: [id number]  DOS:  04/10/2014 Type of visit - description: Surgical clearance History: Patient in need of hip surgery, needs surgical clearance. besides the pain she feels well   ROS Denies chest pain, difficulty breathing, dyspnea on exertion, orthopnea. No nausea, vomiting, diarrhea or blood in the stools No dysuria, gross hematuria or difficulty urinating  Past Medical History  Diagnosis Date  . H/O headache   . Thalassemia      s/p eval hematology 01-2012)  . DJD (degenerative joint disease)     Past Surgical History  Procedure Laterality Date  . Knee surgery Left 6-13    knee scope    History   Social History  . Marital Status: Single    Spouse Name: N/A    Number of Children: 0  . 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 Smoking status: Light Tobacco Smoker  . Smokeless tobacco: Never Used  . Alcohol Use: Yes     Comment: socially   . Drug Use: No  . Sexual Activity: Not on file   Other Topics Concern  . Not on file   Social History Narrative   Lives w/ parents, has a high school diploma        Medication List       This list is accurate as of: 04/10/14 10:39 PM.  Always use your most recent med list.               FOLIC ACID PO  Take 1 tablet by mouth daily.     multivitamin capsule  Take 1 capsule by mouth daily.           Objective:   Physical Exam BP 108/72  Pulse 81  Temp(Src) 97.9 F (36.6 C)  Wt 244 lb (110.678 kg)  SpO2 97%  LMP 03/05/2014  General -- alert, well-developed, NAD.  Neck --Unable to check for JVD, patient is sitting in a chair HEENT-- Not pale.  Lungs -- normal respiratory effort, no intercostal retractions, no accessory muscle use, and normal breath sounds.  Heart-- normal rate, regular rhythm, no murmur.   Extremities-- trace symmetric pretibial edema bilaterally    Neurologic--  alert & oriented X3. Speech normal, gait difficult d/t pain, strength symmetric and appropriate for age.   psych-- Cognition and judgment appear intact. Cooperative with normal attention span and concentration. No anxious or depressed appearing.         Assessment & Plan:    Surgical clearance, Recent labs satisfactory, recent EKG negative. Epworth scale  Few weeks ago was negative for sleep apnea. Plan: The patient is cleared for surgery, will fax a note to Dr. Turner Danielsowan and cc this note

## 2014-04-10 NOTE — Assessment & Plan Note (Signed)
To have   elective hip surgery, she is clear for surgery

## 2014-04-10 NOTE — Telephone Encounter (Signed)
medical clearance form faxed [facility].

## 2014-04-11 MED ORDER — CEFAZOLIN SODIUM-DEXTROSE 2-3 GM-% IV SOLR
2.0000 g | INTRAVENOUS | Status: AC
  Administered 2014-04-12: 2 g via INTRAVENOUS
  Filled 2014-04-11: qty 50

## 2014-04-11 MED ORDER — DEXTROSE-NACL 5-0.45 % IV SOLN: INTRAVENOUS | Status: DC

## 2014-04-11 MED ORDER — TRANEXAMIC ACID 100 MG/ML IV SOLN
1000.0000 mg | INTRAVENOUS | Status: AC
  Administered 2014-04-12: 1000 mg via INTRAVENOUS
  Filled 2014-04-11: qty 10

## 2014-04-11 NOTE — H&P (Signed)
TOTAL HIP ADMISSION H&P  Patient is admitted for left total hip arthroplasty.  Subjective:  Chief Complaint: left hip pain  HPI: Jessica Patrick, 43 y.o. female, has a history of pain and functional disability in the left hip(s) due to arthritis and patient has failed non-surgical conservative treatments for greater than 12 weeks to include NSAID's and/or analgesics, use of assistive devices, weight reduction as appropriate and activity modification.  Onset of symptoms was gradual starting several years ago with gradually worsening course since that time.The patient noted no past surgery on the left hip(s).  Patient currently rates pain in the left hip at 10 out of 10 with activity. Patient has night pain, worsening of pain with activity and weight bearing, pain that interfers with activities of daily living, pain with passive range of motion and crepitus. Patient has evidence of periarticular osteophytes and joint space narrowing by imaging studies. This condition presents safety issues increasing the risk of falls. There is no current active infection.  Patient Active Problem List   Diagnosis Date Noted  . Annual physical exam 12/24/2011  . DJD (degenerative joint disease) 10/09/2009  . HEADACHE 07/07/2008   Past Medical History  Diagnosis Date  . H/O headache   . Thalassemia      s/p eval hematology 01-2012)  . DJD (degenerative joint disease)     Past Surgical History  Procedure Laterality Date  . Knee surgery Left 6-13    knee scope    No prescriptions prior to admission   Allergies  Allergen Reactions  . Lactose Intolerance (Gi)     History  Substance Use Topics  . Smoking status: Light Tobacco Smoker  . Smokeless tobacco: Never Used  . Alcohol Use: Yes     Comment: socially     Family History  Problem Relation Age of Onset  . Diabetes Mother   . Hypertension Mother   . Hypertension Father   . Hyperlipidemia Mother   . Colon cancer Mother [id number]  . Breast cancer Neg Hx    . CAD Neg Hx      Review of Systems  Constitutional: Negative.   HENT: Negative.   Eyes: Negative.   Respiratory: Negative.   Cardiovascular: Negative.   Gastrointestinal: Negative.   Genitourinary: Negative.   Musculoskeletal: Positive for joint pain.  Skin: Negative.   Neurological: Negative.   Endo/Heme/Allergies: Negative.   Psychiatric/Behavioral: Negative.     Objective:  Physical Exam  Constitutional: She is oriented to person, place, and time. She appears well-developed and well-nourished.  HENT:  Head: Normocephalic and atraumatic.  Eyes: Pupils are equal, round, and reactive to light.  Neck: Normal range of motion. Neck supple.  Cardiovascular: Intact distal pulses.   Respiratory: Effort normal.  Musculoskeletal: She exhibits tenderness.  Patient continues to have obvious pain with range of motion of the left hip.  Severely limited internal and external rotation.  She is neurovascularly intact distally.  Neurological: She is alert and oriented to person, place, and time.  Skin: Skin is warm and dry.  Psychiatric: She has a normal mood and affect. Her behavior is normal. Judgment and thought content normal.    Vital signs in last 24 hours: Temp:  [97.9 F (36.6 C)] 97.9 F (36.6 C) (08/03 1602) Pulse Rate:  [81] 81 (08/03 1602) BP: (108)/(72) 108/72 mmHg (08/03 1602) SpO2:  [97 %] 97 % (08/03 1602) Weight:  [110.678 kg (244 lb)] 110.678 kg (244 lb) (08/03 1602)  Labs:   Estimated body mass index  is 43.24 kg/(m^2) as calculated from the following:   Height as of 01/11/14: 5' 3.5" (1.613 m).   Weight as of 01/11/14: 112.492 kg (248 lb).   Imaging Review Radiographs:  X-rays today included; AP of the pelvis and one view of the left hip are taken and reviewed in office today.  Patient has obvious end-stage arthritis of left hip with periarticular spurring and collapse of the femoral head.  Assessment/Plan:  End stage arthritis, left hip(s)  The patient  history, physical examination, clinical judgement of the provider and imaging studies are consistent with end stage degenerative joint disease of the left hip(s) and total hip arthroplasty is deemed medically necessary. The treatment options including medical management, injection therapy, arthroscopy and arthroplasty were discussed at length. The risks and benefits of total hip arthroplasty were presented and reviewed. The risks due to aseptic loosening, infection, stiffness, dislocation/subluxation,  thromboembolic complications and other imponderables were discussed.  The patient acknowledged the explanation, agreed to proceed with the plan and consent was signed. Patient is being admitted for inpatient treatment for surgery, pain control, PT, OT, prophylactic antibiotics, VTE prophylaxis, progressive ambulation and ADL's and discharge planning.The patient is planning to be discharged home with home health services

## 2014-04-12 ENCOUNTER — Inpatient Hospital Stay (HOSPITAL_COMMUNITY)
Admission: RE | Admit: 2014-04-12 | Discharge: 2014-04-15 | DRG: 470 | Disposition: A | Discharge status: Home Health | Payer: Managed Care, Other (non HMO) | Source: Ambulatory Visit | Attending: Orthopedic Surgery | Admitting: Orthopedic Surgery

## 2014-04-12 ENCOUNTER — Encounter (HOSPITAL_COMMUNITY): Payer: Managed Care, Other (non HMO) | Admitting: Anesthesiology

## 2014-04-12 ENCOUNTER — Inpatient Hospital Stay (HOSPITAL_COMMUNITY): Payer: Managed Care, Other (non HMO) | Admitting: Anesthesiology

## 2014-04-12 ENCOUNTER — Encounter (HOSPITAL_COMMUNITY): Payer: Self-pay | Admitting: *Deleted

## 2014-04-12 ENCOUNTER — Inpatient Hospital Stay (HOSPITAL_COMMUNITY): Payer: Managed Care, Other (non HMO)

## 2014-04-12 ENCOUNTER — Encounter (HOSPITAL_COMMUNITY)
Admission: RE | Disposition: A | Discharge status: Home Health | Payer: Self-pay | Source: Ambulatory Visit | Attending: Orthopedic Surgery

## 2014-04-12 DIAGNOSIS — F17200 Nicotine dependence, unspecified, uncomplicated: Secondary | ICD-10-CM | POA: Diagnosis present

## 2014-04-12 DIAGNOSIS — M169 Osteoarthritis of hip, unspecified: Principal | ICD-10-CM | POA: Diagnosis present

## 2014-04-12 DIAGNOSIS — M1612 Unilateral primary osteoarthritis, left hip: Secondary | ICD-10-CM | POA: Diagnosis present

## 2014-04-12 DIAGNOSIS — E669 Obesity, unspecified: Secondary | ICD-10-CM | POA: Diagnosis present

## 2014-04-12 DIAGNOSIS — M1610 Unilateral primary osteoarthritis, unspecified hip: Principal | ICD-10-CM | POA: Diagnosis present

## 2014-04-12 DIAGNOSIS — Z6841 Body Mass Index (BMI) 40.0 and over, adult: Secondary | ICD-10-CM

## 2014-04-12 HISTORY — PX: TOTAL HIP ARTHROPLASTY: SHX124

## 2014-04-12 LAB — HCG, SERUM, QUALITATIVE: Preg, Serum: NEGATIVE

## 2014-04-12 SURGERY — ARTHROPLASTY, HIP, TOTAL,POSTERIOR APPROACH
Anesthesia: General | Site: Hip | Laterality: Left

## 2014-04-12 MED ORDER — GLYCOPYRROLATE 0.2 MG/ML IJ SOLN
INTRAMUSCULAR | Status: AC
  Filled 2014-04-12: qty 3

## 2014-04-12 MED ORDER — ASPIRIN EC 325 MG PO TBEC
325.0000 mg | DELAYED_RELEASE_TABLET | Freq: Every day | ORAL | Status: DC
  Administered 2014-04-14 – 2014-04-15 (×2): 325 mg via ORAL
  Filled 2014-04-12 (×5): qty 1

## 2014-04-12 MED ORDER — FLEET ENEMA 7-19 GM/118ML RE ENEM
1.0000 | ENEMA | Freq: Once | RECTAL | Status: AC | PRN
  Filled 2014-04-12: qty 1

## 2014-04-12 MED ORDER — TRANEXAMIC ACID 100 MG/ML IV SOLN
1000.0000 mg | INTRAVENOUS | Status: DC
  Filled 2014-04-12 (×2): qty 10

## 2014-04-12 MED ORDER — BUPIVACAINE-EPINEPHRINE (PF) 0.5% -1:200000 IJ SOLN
INTRAMUSCULAR | Status: AC
  Filled 2014-04-12: qty 30

## 2014-04-12 MED ORDER — GLYCOPYRROLATE 0.2 MG/ML IJ SOLN
INTRAMUSCULAR | Status: DC | PRN
  Administered 2014-04-12: .7 mg via INTRAVENOUS

## 2014-04-12 MED ORDER — MIDAZOLAM HCL 5 MG/5ML IJ SOLN
INTRAMUSCULAR | Status: DC | PRN
  Administered 2014-04-12: 2 mg via INTRAVENOUS

## 2014-04-12 MED ORDER — FENTANYL CITRATE 0.05 MG/ML IJ SOLN
INTRAMUSCULAR | Status: AC
  Filled 2014-04-12: qty 2

## 2014-04-12 MED ORDER — DIPHENHYDRAMINE HCL 12.5 MG/5ML PO ELIX: 12.5000 mg | ORAL_SOLUTION | ORAL | Status: DC | PRN

## 2014-04-12 MED ORDER — ROCURONIUM BROMIDE 50 MG/5ML IV SOLN
INTRAVENOUS | Status: AC
  Filled 2014-04-12: qty 1

## 2014-04-12 MED ORDER — DOCUSATE SODIUM 100 MG PO CAPS
100.0000 mg | ORAL_CAPSULE | Freq: Two times a day (BID) | ORAL | Status: DC
  Administered 2014-04-14: 100 mg via ORAL
  Filled 2014-04-12 (×3): qty 1

## 2014-04-12 MED ORDER — PHENOL 1.4 % MT LIQD: 1.0000 | OROMUCOSAL | Status: DC | PRN

## 2014-04-12 MED ORDER — LIDOCAINE HCL (CARDIAC) 20 MG/ML IV SOLN
INTRAVENOUS | Status: DC | PRN
  Administered 2014-04-12: 75 mg via INTRAVENOUS

## 2014-04-12 MED ORDER — KCL IN DEXTROSE-NACL 20-5-0.45 MEQ/L-%-% IV SOLN
INTRAVENOUS | Status: DC
  Administered 2014-04-13 (×2): via INTRAVENOUS
  Filled 2014-04-12 (×8): qty 1000

## 2014-04-12 MED ORDER — FENTANYL CITRATE 0.05 MG/ML IJ SOLN
INTRAMUSCULAR | Status: AC
  Filled 2014-04-12: qty 5

## 2014-04-12 MED ORDER — ACETAMINOPHEN 650 MG RE SUPP: 650.0000 mg | Freq: Four times a day (QID) | RECTAL | Status: DC | PRN

## 2014-04-12 MED ORDER — ONDANSETRON HCL 4 MG/2ML IJ SOLN: 4.0000 mg | Freq: Once | INTRAMUSCULAR | Status: DC | PRN

## 2014-04-12 MED ORDER — MIDAZOLAM HCL 2 MG/2ML IJ SOLN
INTRAMUSCULAR | Status: AC
  Filled 2014-04-12: qty 2

## 2014-04-12 MED ORDER — BUPIVACAINE-EPINEPHRINE 0.5% -1:200000 IJ SOLN
INTRAMUSCULAR | Status: DC | PRN
  Administered 2014-04-12: 10 mL

## 2014-04-12 MED ORDER — ALUMINUM HYDROXIDE GEL 320 MG/5ML PO SUSP
15.0000 mL | ORAL | Status: DC | PRN
  Filled 2014-04-12: qty 30

## 2014-04-12 MED ORDER — OXYCODONE HCL 5 MG/5ML PO SOLN: 5.0000 mg | Freq: Once | ORAL | Status: AC | PRN

## 2014-04-12 MED ORDER — MENTHOL 3 MG MT LOZG
1.0000 | LOZENGE | OROMUCOSAL | Status: DC | PRN
Start: 2014-04-12 — End: 2014-04-15

## 2014-04-12 MED ORDER — OXYCODONE HCL 5 MG PO TABS
5.0000 mg | ORAL_TABLET | ORAL | Status: DC | PRN
  Administered 2014-04-12: 5 mg via ORAL
  Administered 2014-04-14: 10 mg via ORAL
  Filled 2014-04-12: qty 2

## 2014-04-12 MED ORDER — METOCLOPRAMIDE HCL 5 MG/ML IJ SOLN: 5.0000 mg | Freq: Three times a day (TID) | INTRAMUSCULAR | Status: DC | PRN

## 2014-04-12 MED ORDER — NEOSTIGMINE METHYLSULFATE 10 MG/10ML IV SOLN
INTRAVENOUS | Status: DC | PRN
  Administered 2014-04-12: 3.5 mg via INTRAVENOUS

## 2014-04-12 MED ORDER — LACTATED RINGERS IV SOLN
INTRAVENOUS | Status: DC | PRN
  Administered 2014-04-12 (×2): via INTRAVENOUS

## 2014-04-12 MED ORDER — OXYCODONE-ACETAMINOPHEN 5-325 MG PO TABS: 1.0000 | ORAL_TABLET | ORAL | Status: AC | PRN

## 2014-04-12 MED ORDER — LIDOCAINE HCL (CARDIAC) 20 MG/ML IV SOLN
INTRAVENOUS | Status: AC
  Filled 2014-04-12: qty 5

## 2014-04-12 MED ORDER — ONDANSETRON HCL 4 MG/2ML IJ SOLN: 4.0000 mg | Freq: Four times a day (QID) | INTRAMUSCULAR | Status: DC | PRN

## 2014-04-12 MED ORDER — SENNOSIDES-DOCUSATE SODIUM 8.6-50 MG PO TABS: 1.0000 | ORAL_TABLET | Freq: Every evening | ORAL | Status: DC | PRN

## 2014-04-12 MED ORDER — ONDANSETRON HCL 4 MG PO TABS: 4.0000 mg | ORAL_TABLET | Freq: Four times a day (QID) | ORAL | Status: DC | PRN

## 2014-04-12 MED ORDER — ONDANSETRON HCL 4 MG/2ML IJ SOLN
INTRAMUSCULAR | Status: DC | PRN
  Administered 2014-04-12: 4 mg via INTRAVENOUS

## 2014-04-12 MED ORDER — METHOCARBAMOL 500 MG PO TABS
ORAL_TABLET | ORAL | Status: AC
Start: 2014-04-12 — End: 2014-04-13
  Filled 2014-04-12: qty 1

## 2014-04-12 MED ORDER — ACETAMINOPHEN 325 MG PO TABS
650.0000 mg | ORAL_TABLET | Freq: Four times a day (QID) | ORAL | Status: DC | PRN
  Administered 2014-04-13 – 2014-04-14 (×4): 650 mg via ORAL
  Filled 2014-04-12 (×4): qty 2

## 2014-04-12 MED ORDER — METOCLOPRAMIDE HCL 5 MG PO TABS: 5.0000 mg | ORAL_TABLET | Freq: Three times a day (TID) | ORAL | Status: DC | PRN

## 2014-04-12 MED ORDER — PROPOFOL 10 MG/ML IV BOLUS
INTRAVENOUS | Status: DC | PRN
  Administered 2014-04-12: 160 mg via INTRAVENOUS

## 2014-04-12 MED ORDER — ONDANSETRON HCL 4 MG/2ML IJ SOLN
INTRAMUSCULAR | Status: AC
  Filled 2014-04-12: qty 2

## 2014-04-12 MED ORDER — METHOCARBAMOL 1000 MG/10ML IJ SOLN
500.0000 mg | Freq: Four times a day (QID) | INTRAVENOUS | Status: DC | PRN
  Filled 2014-04-12: qty 5

## 2014-04-12 MED ORDER — ROCURONIUM BROMIDE 100 MG/10ML IV SOLN
INTRAVENOUS | Status: DC | PRN
  Administered 2014-04-12: 50 mg via INTRAVENOUS

## 2014-04-12 MED ORDER — METHOCARBAMOL 500 MG PO TABS: 500.0000 mg | ORAL_TABLET | Freq: Two times a day (BID) | ORAL | Status: AC

## 2014-04-12 MED ORDER — NEOSTIGMINE METHYLSULFATE 10 MG/10ML IV SOLN
INTRAVENOUS | Status: AC
  Filled 2014-04-12: qty 1

## 2014-04-12 MED ORDER — FENTANYL CITRATE 0.05 MG/ML IJ SOLN
25.0000 ug | INTRAMUSCULAR | Status: DC | PRN
  Administered 2014-04-12 (×2): 50 ug via INTRAVENOUS

## 2014-04-12 MED ORDER — FENTANYL CITRATE 0.05 MG/ML IJ SOLN
INTRAMUSCULAR | Status: DC | PRN
  Administered 2014-04-12: 50 ug via INTRAVENOUS
  Administered 2014-04-12: 100 ug via INTRAVENOUS
  Administered 2014-04-12: 150 ug via INTRAVENOUS
  Administered 2014-04-12: 50 ug via INTRAVENOUS

## 2014-04-12 MED ORDER — LACTATED RINGERS IV SOLN
INTRAVENOUS | Status: DC
Start: 2014-04-12 — End: 2014-04-12
  Administered 2014-04-12: 13:00:00 via INTRAVENOUS

## 2014-04-12 MED ORDER — BISACODYL 5 MG PO TBEC
5.0000 mg | DELAYED_RELEASE_TABLET | Freq: Every day | ORAL | Status: DC | PRN
  Administered 2014-04-14: 5 mg via ORAL
  Filled 2014-04-12: qty 1

## 2014-04-12 MED ORDER — SODIUM CHLORIDE 0.9 % IR SOLN
Status: DC | PRN
  Administered 2014-04-12: 1000 mL

## 2014-04-12 MED ORDER — ASPIRIN EC 325 MG PO TBEC: 325.0000 mg | DELAYED_RELEASE_TABLET | Freq: Two times a day (BID) | ORAL | Status: AC

## 2014-04-12 MED ORDER — HYDROMORPHONE HCL PF 1 MG/ML IJ SOLN: 0.5000 mg | INTRAMUSCULAR | Status: DC | PRN

## 2014-04-12 MED ORDER — METHOCARBAMOL 500 MG PO TABS
500.0000 mg | ORAL_TABLET | Freq: Four times a day (QID) | ORAL | Status: DC | PRN
  Administered 2014-04-12: 500 mg via ORAL
  Filled 2014-04-12: qty 1

## 2014-04-12 MED ORDER — PROPOFOL 10 MG/ML IV BOLUS
INTRAVENOUS | Status: AC
  Filled 2014-04-12: qty 20

## 2014-04-12 MED ORDER — OXYCODONE HCL 5 MG PO TABS
ORAL_TABLET | ORAL | Status: AC
  Filled 2014-04-12: qty 2

## 2014-04-12 MED ORDER — OXYCODONE HCL 5 MG PO TABS
5.0000 mg | ORAL_TABLET | Freq: Once | ORAL | Status: AC | PRN
  Administered 2014-04-12: 5 mg via ORAL

## 2014-04-12 SURGICAL SUPPLY — 57 items
BLADE SAW SGTL 18X1.27X75 (BLADE) ×2 IMPLANT
BLADE SAW SGTL 18X1.27X75MM (BLADE) ×1
BRUSH FEMORAL CANAL (MISCELLANEOUS) IMPLANT
CAPT HIP PF COP ×3 IMPLANT
COVER BACK TABLE 24X17X13 BIG (DRAPES) IMPLANT
COVER SURGICAL LIGHT HANDLE (MISCELLANEOUS) ×6 IMPLANT
DRAPE ORTHO SPLIT 77X108 STRL (DRAPES) ×4
DRAPE PROXIMA HALF (DRAPES) ×3 IMPLANT
DRAPE SURG ORHT 6 SPLT 77X108 (DRAPES) ×2 IMPLANT
DRAPE U-SHAPE 47X51 STRL (DRAPES) ×3 IMPLANT
DRILL BIT 7/64X5 (BIT) ×3 IMPLANT
DRSG AQUACEL AG ADV 3.5X10 (GAUZE/BANDAGES/DRESSINGS) ×3 IMPLANT
DRSG AQUACEL AG ADV 3.5X14 (GAUZE/BANDAGES/DRESSINGS) ×3 IMPLANT
DURAPREP 26ML APPLICATOR (WOUND CARE) ×3 IMPLANT
ELECT BLADE 4.0 EZ CLEAN MEGAD (MISCELLANEOUS)
ELECT REM PT RETURN 9FT ADLT (ELECTROSURGICAL) ×3
ELECTRODE BLDE 4.0 EZ CLN MEGD (MISCELLANEOUS) IMPLANT
ELECTRODE REM PT RTRN 9FT ADLT (ELECTROSURGICAL) ×1 IMPLANT
GAUZE XEROFORM 1X8 LF (GAUZE/BANDAGES/DRESSINGS) ×3 IMPLANT
GLOVE BIO SURGEON STRL SZ7.5 (GLOVE) ×3 IMPLANT
GLOVE BIO SURGEON STRL SZ8.5 (GLOVE) ×6 IMPLANT
GLOVE BIOGEL PI IND STRL 7.0 (GLOVE) ×1 IMPLANT
GLOVE BIOGEL PI IND STRL 8 (GLOVE) ×2 IMPLANT
GLOVE BIOGEL PI IND STRL 9 (GLOVE) ×1 IMPLANT
GLOVE BIOGEL PI INDICATOR 7.0 (GLOVE) ×2
GLOVE BIOGEL PI INDICATOR 8 (GLOVE) ×4
GLOVE BIOGEL PI INDICATOR 9 (GLOVE) ×2
GOWN STRL REUS W/ TWL LRG LVL3 (GOWN DISPOSABLE) ×2 IMPLANT
GOWN STRL REUS W/ TWL XL LVL3 (GOWN DISPOSABLE) ×3 IMPLANT
GOWN STRL REUS W/TWL LRG LVL3 (GOWN DISPOSABLE) ×4
GOWN STRL REUS W/TWL XL LVL3 (GOWN DISPOSABLE) ×6
HANDPIECE INTERPULSE COAX TIP (DISPOSABLE)
HOOD PEEL AWAY FACE SHEILD DIS (HOOD) ×9 IMPLANT
KIT BASIN OR (CUSTOM PROCEDURE TRAY) ×3 IMPLANT
KIT ROOM TURNOVER OR (KITS) ×3 IMPLANT
MANIFOLD NEPTUNE II (INSTRUMENTS) ×3 IMPLANT
NEEDLE 22X1 1/2 (OR ONLY) (NEEDLE) ×3 IMPLANT
NS IRRIG 1000ML POUR BTL (IV SOLUTION) ×3 IMPLANT
PACK TOTAL JOINT (CUSTOM PROCEDURE TRAY) ×3 IMPLANT
PAD ARMBOARD 7.5X6 YLW CONV (MISCELLANEOUS) ×6 IMPLANT
PASSER SUT SWANSON 36MM LOOP (INSTRUMENTS) ×3 IMPLANT
PRESSURIZER FEMORAL UNIV (MISCELLANEOUS) IMPLANT
SET HNDPC FAN SPRY TIP SCT (DISPOSABLE) IMPLANT
SUT ETHIBOND 2 V 37 (SUTURE) ×3 IMPLANT
SUT VIC AB 0 CTB1 27 (SUTURE) ×3 IMPLANT
SUT VIC AB 0 CTXB 36 (SUTURE) ×3 IMPLANT
SUT VIC AB 1 CTX 36 (SUTURE) ×2
SUT VIC AB 1 CTX36XBRD ANBCTR (SUTURE) ×1 IMPLANT
SUT VIC AB 2-0 CTB1 (SUTURE) ×6 IMPLANT
SUT VIC AB 3-0 SH 27 (SUTURE) ×4
SUT VIC AB 3-0 SH 27X BRD (SUTURE) ×2 IMPLANT
SYR CONTROL 10ML LL (SYRINGE) ×3 IMPLANT
TOWEL OR 17X24 6PK STRL BLUE (TOWEL DISPOSABLE) ×3 IMPLANT
TOWEL OR 17X26 10 PK STRL BLUE (TOWEL DISPOSABLE) ×3 IMPLANT
TOWER CARTRIDGE SMART MIX (DISPOSABLE) IMPLANT
TRAY FOLEY CATH 14FR (SET/KITS/TRAYS/PACK) IMPLANT
WATER STERILE IRR 1000ML POUR (IV SOLUTION) ×12 IMPLANT

## 2014-04-12 NOTE — Anesthesia Procedure Notes (Signed)
Procedure Name: Intubation Date/Time: 04/12/2014 3:42 PM Performed by: Gwenyth AllegraADAMI, Wynn Alldredge Pre-anesthesia Checklist: Emergency Drugs available, Timeout performed, Patient identified, Patient being monitored and Suction available Patient Re-evaluated:Patient Re-evaluated prior to inductionOxygen Delivery Method: Circle system utilized Preoxygenation: Pre-oxygenation with 100% oxygen Intubation Type: IV induction Ventilation: Mask ventilation without difficulty and Oral airway inserted - appropriate to patient size Laryngoscope Size: Mac and 3 Grade View: Grade I Tube type: Oral Tube size: 7.0 mm Number of attempts: 1 Airway Equipment and Method: Stylet Placement Confirmation: ETT inserted through vocal cords under direct vision,  breath sounds checked- equal and bilateral and positive ETCO2 Secured at: 21 cm Tube secured with: Tape Dental Injury: Teeth and Oropharynx as per pre-operative assessment

## 2014-04-12 NOTE — Anesthesia Preprocedure Evaluation (Addendum)
Anesthesia Evaluation  Patient identified by MRN, date of birth, ID band Patient awake    Reviewed: Allergy & Precautions, H&P , NPO status , Patient's Chart, lab work & pertinent test results  Airway Mallampati: II TM Distance: >3 FB Neck ROM: Full    Dental  (+) Teeth Intact, Dental Advisory Given   Pulmonary Current Smoker,  breath sounds clear to auscultation        Cardiovascular Rhythm:Regular Rate:Normal     Neuro/Psych    GI/Hepatic   Endo/Other    Renal/GU      Musculoskeletal   Abdominal (+) + obese,   Peds  Hematology   Anesthesia Other Findings   Reproductive/Obstetrics                          Anesthesia Physical Anesthesia Plan  ASA: III  Anesthesia Plan: General   Post-op Pain Management:    Induction: Intravenous  Airway Management Planned: Oral ETT  Additional Equipment:   Intra-op Plan:   Post-operative Plan: Extubation in OR  Informed Consent: I have reviewed the patients History and Physical, chart, labs and discussed the procedure including the risks, benefits and alternatives for the proposed anesthesia with the patient or authorized representative who has indicated his/her understanding and acceptance.   Dental advisory given  Plan Discussed with: CRNA and Anesthesiologist  Anesthesia Plan Comments: (DJD L. Hip Obesity  Plan GA with oral ETT  Kipp Broodavid Ewin Rehberg)        Anesthesia Quick Evaluation

## 2014-04-12 NOTE — Interval H&P Note (Signed)
History and Physical Interval Note:  04/12/2014 1:35 PM  Jessica BellowDana Pineo  has presented today for surgery, with the diagnosis of LEFT HIP OSTEOARTHRITIS  The various methods of treatment have been discussed with the patient and family. After consideration of risks, benefits and other options for treatment, the patient has consented to  Procedure(s): TOTAL HIP ARTHROPLASTY (Left) as a surgical intervention .  The patient's history has been reviewed, patient examined, no change in status, stable for surgery.  I have reviewed the patient's chart and labs.  Questions were answered to the patient's satisfaction.     Nestor LewandowskyOWAN,March Joos J

## 2014-04-12 NOTE — Op Note (Signed)
OPERATIVE REPORT    DATE OF PROCEDURE:  04/12/2014       PREOPERATIVE DIAGNOSIS:  LEFT HIP OSTEOARTHRITIS                                                          POSTOPERATIVE DIAGNOSIS:  LEFT HIP OSTEOARTHRITIS                                                           PROCEDURE:  L total hip arthroplasty using a 52 mm DePuy Pinnacle  Cup, [employer], 10-degree polyethylene liner index superior  and posterior, a +3 36 mm ceramic head, a 18x13x36x160 SROM stem, 18Dsm Sleeve   SURGEON: Tyeshia Cornforth J    ASSISTANT:   Eric K. [employer]  (present throughout entire procedure and necessary for timely completion of the procedure)   ANESTHESIA: General BLOOD LOSS: 400 FLUID REPLACEMENT: 1900 crystalloid DRAINS: Foley Catheter URINE OUTPUT: 300cc COMPLICATIONS: none    INDICATIONS FOR PROCEDURE: A 43 y.o. year-old With  LEFT HIP OSTEOARTHRITIS   for 3 years, x-rays show bone-on-bone arthritic changes. Despite conservative measures with observation, anti-inflammatory medicine, narcotics, use of a cane, has severe unremitting pain and can ambulate only a few blocks before resting.  Patient desires elective L total hip arthroplasty to decrease pain and increase function. The risks, benefits, and alternatives were discussed at length including but not limited to the risks of infection, bleeding, nerve injury, stiffness, blood clots, the need for revision surgery, cardiopulmonary complications, among others, and they were willing to proceed. Questions answered     PROCEDURE IN DETAIL: The patient was identified by armband,  received preoperative IV antibiotics in the holding area at [facility] taken to the operating room , appropriate anesthetic monitors  were attached and general endotracheal anesthesia induced. Foley catheter was inserted. Pt was rolled into the R lateral decubitus position and fixed there with a Stulberg Mark II pelvic clamp.  The L lower extremity was then  prepped and draped  in the usual sterile fashion from the ankle to the hemipelvis. A time-out  procedure was performed. The skin along the lateral hip and thigh  infiltrated with 10 mL of 0.5% Marcaine and epinephrine solution. We  then made a posterolateral approach to the hip. With a #10 blade, a 24 cm  incision was made through the skin and subcutaneous tissue down to the level of the  IT band. Small bleeders were identified and cauterized. The IT band was cut in  line with skin incision exposing the greater trochanter. A Cobra retractor was placed between the gluteus minimus and the superior hip joint capsule, and a spiked Cobra between the quadratus femoris and the inferior hip joint capsule. This isolated the short  external rotators and piriformis tendons. These were tagged with a #2 Ethibond  suture and cut off their insertion on the intertrochanteric crest. The posterior  capsule was then developed into an acetabular-based flap from Posterior Superior off of the acetabulum out over the femoral neck and back posterior inferior to the acetabular rim. This flap was tagged with two #2 Ethibond  sutures and retracted protecting the sciatic nerve. This exposed the arthritic femoral head and osteophytes. The hip was then flexed and internally rotated, dislocating the femoral head and a standard neck cut performed 1 fingerbreadth above the lesser trochanter.  A spiked Cobra was placed in the cotyloid notch and a Hohmann retractor was then used to lever the femur anteriorly off of the anterior pelvic column. A posterior-inferior wing retractor was placed at the junction of the acetabulum and the ischium completing the acetabular exposure.We then removed the peripheral osteophytes and labrum from the acetabulum. We then reamed the acetabulum up to 51 mm with basket reamers obtaining good coverage in all quadrants. We then irrigated with normal  saline solution and hammered into place a 52 mm pinnacle cup  in 45  degrees of abduction and about 20 degrees of anteversion. More  peripheral osteophytes removed and a trial 10-degree liner placed with the  index superior-posterior. The hip was then flexed and internally rotated exposing the  proximal femur, which was entered with the initiating reamer followed by  the axial reamers up to a 13.5 mm full depth and 14mm partial depth. We then conically reamed to 18D to the correct depth for a 42 base neck. The calcar was milled to 18Dsm. A trial cone and stem was inserted in the 25 degrees anteversion, with a +0 36mm trial head. Trial reduction was then performed and excellent stability was noted with at 90 of flexion with 75 of internal rotation and then full extension with maximal external rotation. The hip could not be dislocated in full extension. The knee could easily flex  to about 130 degrees. We also stretched the abductors at this point,  because of the preexisting adductor contractures. All trial components  were then removed. The acetabulum was irrigated out with normal saline  solution. A titanium [facility] Eliminator was then screwed into place  followed by a 10-degree polyethylene liner index superior-posterior. On  the femoral side a 18Dsm ZTT1 sleeve was hammered into place, followed by a 18x13x36x160 SROM stem in 25 degrees of anteversion. At this point, a +0 36 mm ceramic head was  hammered on the stem. The hip was reduced. We checked our stability  one more time and found it to be excellent. The wound was once again  thoroughly irrigated out with normal saline solution pulse lavage. The  capsular flap and short external rotators were repaired back to the  intertrochanteric crest through drill holes with a #2 Ethibond suture.  The IT band was closed with running 1 Vicryl suture. The subcutaneous  tissue with 0 and 2-0 undyed Vicryl suture and the skin with running  interlocking 3-0 nylon suture. Dressing of Xeroform and Mepilex was  then  applied. The patient was then unclamped, rolled supine, awaken extubated and taken to recovery room without difficulty in stable condition.   Courtnie Brenes J 04/12/2014, 5:10 PM

## 2014-04-12 NOTE — Transfer of Care (Signed)
Immediate Anesthesia Transfer of Care Note  Patient: Regino BellowDana Sarafian  Procedure(s) Performed: Procedure(s): TOTAL HIP ARTHROPLASTY (Left)  Patient Location: PACU  Anesthesia Type:General  Level of Consciousness: awake  Airway & Oxygen Therapy: Patient Spontanous Breathing and Patient connected to face mask oxygen  Post-op Assessment: Report given to PACU RN and Post -op Vital signs reviewed and stable  Post vital signs: Reviewed and stable  Complications: No apparent anesthesia complications

## 2014-04-13 ENCOUNTER — Encounter (HOSPITAL_COMMUNITY): Payer: Self-pay | Admitting: Orthopedic Surgery

## 2014-04-13 LAB — BASIC METABOLIC PANEL
ANION GAP: 13 (ref 5–15)
BUN: 16 mg/dL (ref 6–23)
CHLORIDE: 104 meq/L (ref 96–112)
CO2: 25 meq/L (ref 19–32)
Calcium: 8.4 mg/dL (ref 8.4–10.5)
Creatinine, Ser: 0.72 mg/dL (ref 0.50–1.10)
GFR calc non Af Amer: >90 mL/min (ref >90)
Glucose, Bld: 132 mg/dL — ABNORMAL HIGH (ref 70–99)
POTASSIUM: 4.4 meq/L (ref 3.7–5.3)
SODIUM: 142 meq/L (ref 137–147)

## 2014-04-13 LAB — CBC
HCT: 30.1 % — ABNORMAL LOW (ref 36.0–46.0)
HEMOGLOBIN: 9.6 g/dL — AB (ref 12.0–15.0)
MCH: 20.2 pg — AB (ref 26.0–34.0)
MCHC: 31.9 g/dL (ref 30.0–36.0)
MCV: 63.4 fL — ABNORMAL LOW (ref 78.0–100.0)
Platelets: 202 10*3/uL (ref 150–400)
RBC: 4.75 MIL/uL (ref 3.87–5.11)
RDW: 16.1 % — ABNORMAL HIGH (ref 11.5–15.5)
WBC: 10.6 10*3/uL — ABNORMAL HIGH (ref 4.0–10.5)

## 2014-04-13 NOTE — Anesthesia Postprocedure Evaluation (Signed)
  Anesthesia Post-op Note  Patient: Jessica Patrick  Procedure(s) Performed: Procedure(s): TOTAL HIP ARTHROPLASTY (Left)  Patient Location: PACU  Anesthesia Type:General  Level of Consciousness: awake and alert   Airway and Oxygen Therapy: Patient Spontanous Breathing  Post-op Pain: mild  Post-op Assessment: Post-op Vital signs reviewed, Patient's Cardiovascular Status Stable and Respiratory Function Stable  Post-op Vital Signs: Reviewed  Filed Vitals:   04/13/14 1029  BP: 123/74  Pulse: 105  Temp: 37.1 C  Resp: 18    Complications: No apparent anesthesia complications

## 2014-04-13 NOTE — Progress Notes (Signed)
[facility]  Patient Status: New  [facility] is providing the following services: PT - referral from MD office.   If patient discharges after hours, please call [phone].   Jodene NamMary Hickling 04/13/2014, 5:10 PM

## 2014-04-13 NOTE — Progress Notes (Signed)
Clinical Social Work Department BRIEF PSYCHOSOCIAL ASSESSMENT 04/13/2014  Patient:  Regino BellowCOSTANZO,Nilda     Account Number:  [id number]     Admit date:  04/12/2014  Clinical Social Worker:  Cristy FolksBEST,Gerhart Ruggieri, LCSW  Date/Time:  04/13/2014 12:00 N  Referred by:  Physician  Date Referred:  04/12/2014 Referred for  SNF Placement   Other Referral:  N/A Interview type:  Patient Other interview type:    PSYCHOSOCIAL DATA Living Status:  PARENTS Admitted from facility:  N/A Level of care:  N/A Primary support name:  Janne LabVeronica Peplinski Primary support relationship to patient:  PARENT Degree of support available:   Good support    CURRENT CONCERNS  Other Concerns:  N/A  SOCIAL WORK ASSESSMENT / PLAN SW spoke with pt in regards to disposition. Pt reports that upon discharge that she will go home with her mother and [facility]. Pt reports that she works at [employer] in [place], where she stands the majority of the day. Pt reports that she hopes to be discharged soon. Pt reports that she is motivated to work with PT.   Assessment/plan status:  No Further Intervention Required Other assessment/ plan:   N/A   Information/referral to community resources:   N/A    PATIENT'S/FAMILY'S RESPONSE TO PLAN OF CARE: Pt appreciative of SW visit and is awaiting disposition.     Rosine Beatoris Kayton Dunaj,MSW [phone]

## 2014-04-13 NOTE — Progress Notes (Signed)
Physical Therapy Treatment Patient Details Name: Jessica Patrick MRN: [id number] DOB: 06-Mar-1971 Today's Date: 04/13/2014    History of Present Illness Jessica Patrick is a 43 y.o. Female s/p Lt THA on 04/12/14.  Pt with hx of DJD and a previous knee scope.     PT Comments    Pt continues to require incr time and max encouragement for therapy due to pain. Was able to increase ambulation distance today and performed pericare at [facility] with supervision (A) only. Pt demo gt abnormalities and difficulty swinging Lt LE through for gt. Pt continues to be adamant that she will D/C home with HHPT; discussed at length goals for rehab to safely D/C home. Pt encouraged OOB for meals. Pt continues to be limited in thera ex due to pain. Will cont to follow per POC.   Follow Up Recommendations  Home health PT;Supervision/Assistance - 24 hour;Other (comment)     Equipment Recommendations  Rolling walker with 5" wheels    Recommendations for Other Services       Precautions / Restrictions Precautions Precautions: Posterior Hip;Fall Precaution Booklet Issued: Yes (comment) Precaution Comments: pt unable to recall hip precautions as reviewed earlier session Restrictions Weight Bearing Restrictions: Yes LLE Weight Bearing: Weight bearing as tolerated    Mobility  Bed Mobility Overal bed mobility: Needs Assistance Bed Mobility: Supine to Sit;Sit to Supine     Supine to sit: Min assist;HOB elevated Sit to supine: Min assist   General bed mobility comments: progressing to min (A) with use of handrails; (A) to manage Lt LE and maintain hip precautions  Transfers Overall transfer level: Needs assistance Equipment used: Rolling walker (2 wheeled) Transfers: Sit to/from Stand Sit to Stand: Supervision Stand pivot transfers: Min assist       General transfer comment: supervision for safety when transferring from toilet; min guar when transferring from lower surface bed;cues for hand placement and  technique with RW  Ambulation/Gait Ambulation/Gait assistance: Supervision Ambulation Distance (Feet): 80 Feet Assistive device: Rolling walker (2 wheeled) Gait Pattern/deviations: Step-to pattern;Decreased weight shift to left;Decreased step length - left;Narrow base of support;Trunk flexed Gait velocity: very decr Gait velocity interpretation: Below normal speed for age/gender General Gait Details: pt with difficulty swinging Lt hip through during swing phase due to pain; max cues for upright posture and gt sequencing; very slow and stopped multiple times to stand and talk/rest   Stairs            Wheelchair Mobility    Modified Rankin (Stroke Patients Only)       Balance Overall balance assessment: Needs assistance Sitting-balance support: Feet supported;No upper extremity supported Sitting balance-Leahy Scale: Fair     Standing balance support: During functional activity;Bilateral upper extremity supported Standing balance-Leahy Scale: Poor Standing balance comment: was able to stand and perform pericare with single UE support and supervision                     Cognition Arousal/Alertness: Awake/alert Behavior During Therapy: Anxious Overall Cognitive Status: Within Functional Limits for tasks assessed       Memory: Decreased recall of precautions              Exercises Total Joint Exercises Ankle Circles/Pumps: AROM;PROM;Both;10 reps;Other (comment) Other Exercises Other Exercises: pt continues to be limited and refusing exercises due to pain    General Comments        Pertinent Vitals/Pain Pain Assessment: 0-10 Pain Score: 4  (reaches 10 with activity) Pain Location: surgical site  Pain Descriptors / Indicators: Aching;Constant;Operative site guarding Pain Intervention(s): Premedicated before session;Repositioned    Home Living Family/patient expects to be discharged to:: Private residence Living Arrangements: Parent Available Help  at Discharge: Family;Available 24 hours/day Type of Home: House Home Access: Stairs to enter Entrance Stairs-Rails: None Home Layout: Multi-level;Bed/bath upstairs Home Equipment: Cane - single point;Bedside commode;Shower seat;Hand held shower head      Prior Function Level of Independence: Independent with assistive device(s)      Comments: ambulated with cane secondary to pain   PT Goals (current goals can now be found in the care plan section) Acute Rehab PT Goals Patient Stated Goal: to get back to being me PT Goal Formulation: With patient Time For Goal Achievement: 04/17/14 Potential to Achieve Goals: Good Progress towards PT goals: Progressing toward goals    Frequency  7X/week    PT Plan Current plan remains appropriate    Co-evaluation             End of Session Equipment Utilized During Treatment: Gait belt Activity Tolerance: Patient limited by pain Patient left: in bed;with call bell/phone within reach     Time: [id number] PT Time Calculation (min): 31 min  Charges:  $Gait Training: 8-22 mins $Therapeutic Activity: 8-22 mins                    G CodesDonnamarie Poag:      Jessica Patrick [place], [place]  [phone] 04/13/2014, 5:00 PM

## 2014-04-13 NOTE — Progress Notes (Signed)
Occupational Therapy Evaluation Patient Details Name: Jessica Patrick Garbutt MRN: [id number] DOB: 01/27/1971 Today's Date: 04/13/2014    History of Present Illness Jessica Patrick Taul is a 43 y.o. Female s/p Lt THA on 04/12/14.  Pt with hx of DJD and a previous knee scope.    Clinical Impression   PTA pt lived at home with her parents and was independent with use of a cane for ADLs and functional mobility. Pt is limited by pain during today's OT session and became quickly fatigued getting from recliner to bed. Pt with difficulty during bed mobility and expressed frustration with mobility limitations. Pt would benefit from skilled OT for LB ADLs and functional mobility to increase independence. May need to consider SNF placement if pt does not make progress with mobility.     Follow Up Recommendations  Home health OT;Supervision/Assistance - 24 hour (may need to consider SNF if pt does not progress well)    Equipment Recommendations  None recommended by OT       Precautions / Restrictions Precautions Precautions: Posterior Hip;Fall Precaution Booklet Issued: Yes (comment) Precaution Comments: reviewed posterior hip precautions with pt Restrictions Weight Bearing Restrictions: Yes LLE Weight Bearing: Weight bearing as tolerated      Mobility Bed Mobility Overal bed mobility: Needs Assistance Bed Mobility: Sit to Supine       Sit to supine: Mod assist   General bed mobility comments: (A) for Bil LEs onto bed, increased time due to pain, max cues for sequencing  Transfers Overall transfer level: Needs assistance Equipment used: Rolling walker (2 wheeled) Transfers: Sit to/from [employer]GI CorporationStand;Stand Pivot Transfers Sit to Stand: Min assist Stand pivot transfers: Min assist       General transfer comment: (A) to maintain balance and manage RW: cues for posterior hip precautions          ADL Overall ADL's : Needs assistance/impaired Eating/Feeding: Independent;Sitting   Grooming: Set  up;Sitting   Upper Body Bathing: Set up;Sitting   Lower Body Bathing: Maximal assistance;Sit to/from stand   Upper Body Dressing : Set up;Sitting   Lower Body Dressing: Maximal assistance;Sit to/from stand   Toilet Transfer: Minimal assistance;Stand-pivot;RW;BSC   Toileting- Clothing Manipulation and Hygiene: Maximal assistance;Sit to/from stand   Tub/ Engineer, structuralhower Transfer: Total assistance   Functional mobility during ADLs:  (pt unable to complete functional mobility at this time) General ADL Comments: Pt is limited by pain and demonstrates irritability and frustration. Pt demonstrates some problem solving deficits and decreased memory and attention, however feel that this is pt's baseline. Pt was minimally cooperative in OT session. Mother provided home environment answers as pt provided sarcastic remarks in response to questions.      Vision  No apparent visual deficits.                    Perception Perception Perception Tested?: No   Praxis Praxis Praxis tested?: Within functional limits    Pertinent Vitals/Pain Pain Assessment: 0-10 Pain Score: 10-Worst pain ever Pain Location: Lt hip (surgical site) Pain Intervention(s): Limited activity within patient's tolerance;Repositioned;Patient requesting pain meds-RN notified;Ice applied        Extremity/Trunk Assessment Upper Extremity Assessment Upper Extremity Assessment: Generalized weakness   Lower Extremity Assessment Lower Extremity Assessment: Defer to PT evaluation   Cervical / Trunk Assessment Cervical / Trunk Assessment: Normal   Communication Communication Communication: No difficulties   Cognition Arousal/Alertness: Awake/alert Behavior During Therapy: Anxious Overall Cognitive Status: Within Functional Limits for tasks assessed (per pt's mother, pt is at baseline)  Memory: Decreased recall of precautions                        Home Living Family/patient expects to be discharged  to:: Private residence Living Arrangements: Parent Available Help at Discharge: Family;Available 24 hours/day Type of Home: House Home Access: Stairs to enter [employer] of Steps: 2 Entrance Stairs-Rails: None Home Layout: Multi-level;Bed/bath upstairs Alternate Level Stairs-Number of Steps: 6 Alternate Level Stairs-Rails: Right;Left;Can reach both Bathroom Shower/Tub: Walk-in shower         Home Equipment: Gilmer Mor - single point;Bedside commode;Shower seat;Hand held shower head          Prior Functioning/Environment Level of Independence: Independent with assistive device(s)        Comments: ambulated with cane secondary to pain    OT Diagnosis: Generalized weakness;Acute pain   OT Problem List: Decreased strength;Decreased range of motion;Decreased activity tolerance;Impaired balance (sitting and/or standing);Decreased safety awareness;Decreased knowledge of use of DME or AE;Decreased knowledge of precautions;Pain   OT Treatment/Interventions: Self-care/ADL training;Therapeutic exercise;Energy conservation;DME and/or AE instruction;Therapeutic activities;Patient/family education;Balance training    OT Goals(Current goals can be found in the care plan section) Acute Rehab OT Goals Patient Stated Goal: to do what I want without pain OT Goal Formulation: With patient/family Time For Goal Achievement: 04/27/14 Potential to Achieve Goals: Good ADL Goals Pt Will Perform Grooming: with supervision;standing Pt Will Transfer to Toilet: with supervision;ambulating;bedside commode Pt Will Perform Toileting - Clothing Manipulation and hygiene: with supervision;sit to/from stand Pt Will Perform Tub/Shower Transfer: Shower transfer;with supervision;ambulating;3 in 1;rolling walker Additional ADL Goal #1: Pt will perform bed mobility with Supervision using leg lifter to prepare for ADLs.   OT Frequency: Min 2X/week   Barriers to D/C: Other (comment)  Pt's mother unable to  provide much physical assistance.          End of Session Equipment Utilized During Treatment: Gait belt;Rolling walker Nurse Communication: Mobility status;Patient requests pain meds  Activity Tolerance: Patient limited by pain Patient left: in bed;with call bell/phone within reach;with family/visitor present   Time: 1610-9604 OT Time Calculation (min): 22 min Charges:  OT General Charges $OT Visit: 1 Procedure OT Evaluation $Initial OT Evaluation Tier I: 1 Procedure OT Treatments $Self Care/Home Management : 8-22 mins  Rae Lips [phone] 04/13/2014, 3:02 PM

## 2014-04-13 NOTE — Evaluation (Signed)
Physical Therapy Evaluation Patient Details Name: Jessica Patrick MRN: [id number] DOB: [birth year] Today's Date: 04/13/2014   History of Present Illness  43 y.o. female s/p Lt THA 04/12/14.  Clinical Impression  Pt is s/p Lt posterior THA POD#1 resulting in the deficits listed below (see PT Problem List).  Pt will benefit from skilled PT to increase their independence and safety with mobility to allow discharge to the venue listed below. Pt mobilizes slowly and was limited to SPT to chair only during evaluation secondary to pain. Will cont to assess need for SNF vs HHPT pending progress.      Follow Up Recommendations Home health PT;Supervision/Assistance - 24 hour;Other (comment) (pending progress )    Equipment Recommendations  Rolling walker with 5" wheels    Recommendations for Other Services OT consult     Precautions / Restrictions Precautions Precautions: Posterior Hip;Fall Precaution Booklet Issued: Yes (comment) Precaution Comments: reviewed posterior hip precautions with pt Restrictions Weight Bearing Restrictions: Yes LLE Weight Bearing: Weight bearing as tolerated      Mobility  Bed Mobility Overal bed mobility: Needs Assistance Bed Mobility: Supine to Sit     Supine to sit: Mod assist;HOB elevated     General bed mobility comments: incr time required due to pain; (A) to advance Lt LE and trunk to sitting position; max cues for sequencing  Transfers Overall transfer level: Needs assistance Equipment used: Rolling walker (2 wheeled) Transfers: Sit to/from [employer] Sit to Stand: Min assist;From elevated surface Stand pivot transfers: Min assist       General transfer comment: (A) to maintain balance and manage RW: cues for posterior hip precautions   Ambulation/Gait             General Gait Details: pivotal steps to chair  Stairs            Wheelchair Mobility    Modified Rankin (Stroke Patients Only)       Balance  Overall balance assessment: Needs assistance Sitting-balance support: Feet supported;Single extremity supported Sitting balance-Leahy Scale: Fair     Standing balance support: During functional activity;Bilateral upper extremity supported Standing balance-Leahy Scale: Poor Standing balance comment: relies on RW                              Pertinent Vitals/Pain 6/10 at rest; c/o pain from anterior portion of hip to foot. patient repositioned for comfort     Home Living Family/patient expects to be discharged to:: Private residence Living Arrangements: Parent Available Help at Discharge: Family;Available 24 hours/day Type of Home: House Home Access: Stairs to enter Entrance Stairs-Rails: None Entrance Stairs-Number of Steps: 2 Home Layout: Multi-level;Bed/bath upstairs Home Equipment: Cane - single point      Prior Function Level of Independence: Independent with assistive device(s)         Comments: ambulated with cane secondary to pain     Hand Dominance        Extremity/Trunk Assessment   Upper Extremity Assessment: Defer to OT evaluation           Lower Extremity Assessment: LLE deficits/detail   LLE Deficits / Details: unable to perform PROM ankle pumps secondary to pain   Cervical / Trunk Assessment: Normal  Communication   Communication: No difficulties  Cognition Arousal/Alertness: Awake/alert Behavior During Therapy: Anxious Overall Cognitive Status: No family/caregiver present to determine baseline cognitive functioning Area of Impairment: Problem solving;Attention;Memory   Current Attention Level: Selective Memory:  Decreased recall of precautions       Problem Solving: Slow processing      General Comments      Exercises Total Joint Exercises Ankle Circles/Pumps: AROM;PROM;Both;10 reps;Other (comment) (Lt LE limited due to pain)      Assessment/Plan    PT Assessment Patient needs continued PT services  PT Diagnosis  Difficulty walking;Generalized weakness;Acute pain   PT Problem List Decreased strength;Decreased range of motion;Decreased activity tolerance;Decreased balance;Decreased mobility;Decreased cognition;Decreased knowledge of use of DME;Decreased safety awareness;Decreased knowledge of precautions;Pain;Impaired sensation  PT Treatment Interventions DME instruction;Gait training;Stair training;Functional mobility training;Therapeutic exercise;Therapeutic activities;Balance training;Neuromuscular re-education;Patient/family education   PT Goals (Current goals can be found in the Care Plan section) Acute Rehab PT Goals Patient Stated Goal: to not have pain PT Goal Formulation: With patient Time For Goal Achievement: 04/17/14 Potential to Achieve Goals: Good    Frequency 7X/week   Barriers to discharge Inaccessible home environment multiple steps in split level house    Co-evaluation               End of Session Equipment Utilized During Treatment: Gait belt Activity Tolerance: Patient limited by pain Patient left: in chair;with call bell/phone within reach;with nursing/sitter in room Nurse Communication: Mobility status;Precautions;Weight bearing status         Time: [id number] PT Time Calculation (min): 22 min   Charges:   PT Evaluation $Initial PT Evaluation Tier I: 1 Procedure PT Treatments $Therapeutic Activity: 8-22 mins   PT G CodesDonell Sievert:          Charls [place]uster N, [place]  [phone] 04/13/2014, 9:35 AM

## 2014-04-13 NOTE — Care Management Note (Signed)
CARE MANAGEMENT NOTE 04/13/2014  Patient:  Regino BellowCOSTANZO,Nimco   Account Number:  [id number]  Date Initiated:  04/13/2014  Documentation initiated by:  Vance PeperBRADY,Shaquayla Klimas  Subjective/Objective Assessment:   43 yr old female s/p Left total hip arthroplasty.     Action/Plan:   Case manager spoke with patient concerning home health and DME needs at discharge. Confirmed patient Preoperatively setup with [facility], no changes.   Anticipated DC Date:  04/14/2014   Anticipated DC Plan:  HOME W HOME HEALTH SERVICES      DC Planning Services  CM consult      [facility]  [facility]ALTH  DURABLE MEDICAL EQUIPMENT   Choice offered to / List presented to:  C-1 Patient   DME arranged  [facility]      DME agency  [employer]     HH arranged  HH-2 PT      HH agency  [facility]   Status of service:  Completed, signed off Medicare Important Message given?   (If response is "NO", the following Medicare IM given date fields will be blank) Date Medicare IM given:   Medicare IM given by:   Date Additional Medicare IM given:   Additional Medicare IM given by:    Discharge Disposition:  HOME W HOME HEALTH SERVICES

## 2014-04-13 NOTE — Progress Notes (Signed)
Patient ID: Jessica BellowDana Patrick, female   DOB: 07/31/1971, 43 y.o.   MRN: [id number] PATIENT ID: Jessica BellowDana Patrick  MRN: [id number]  DOB/AGE:  02/02/1971 / 43 y.o.  1 Day Post-Op Procedure(s) (LRB): TOTAL HIP ARTHROPLASTY (Left)    PROGRESS NOTE Subjective: Patient is alert, oriented, no Nausea, no Vomiting, yes passing gas, no Bowel Movement. Taking PO well. Denies SOB, Chest or Calf Pain. Using Incentive Spirometer, PAS in place. Ambulate WBAT today Patient reports pain as 7 on 0-10 scale  .    Objective: Vital signs in last 24 hours: Filed Vitals:   04/12/14 2003 04/13/14 0000 04/13/14 0400 04/13/14 0405  BP: 128/63   125/77  Pulse: 76   107  Temp: 98.3 F (36.8 C)   99.5 F (37.5 C)  TempSrc: Oral   Oral  Resp: 18 18 18 16   Height: 5\' 4"  (1.626 m)     Weight: 112.2 kg (247 lb 5.7 oz)     SpO2: 100% 99% 96% 98%      Intake/Output from previous day: I/O last 3 completed shifts: In: 2200 [I.V.:2200] Out: 125 [Blood:125]   Intake/Output this shift:     LABORATORY DATA:  Recent Labs  04/13/14 0501  WBC 10.6*  HGB 9.6*  HCT 30.1*  PLT 202  NA 142  K 4.4  CL 104  CO2 25  BUN 16  CREATININE 0.72  GLUCOSE 132*  CALCIUM 8.4    Examination: Neurologically intact ABD soft Neurovascular intact Sensation intact distally Intact pulses distally Dorsiflexion/Plantar flexion intact Incision: dressing C/D/I No cellulitis present Compartment soft} XR AP&Lat of hip shows well placed\fixed THA  Assessment:   1 Day Post-Op Procedure(s) (LRB): TOTAL HIP ARTHROPLASTY (Left) ADDITIONAL DIAGNOSIS:  obesity  Plan: PT/OT WBAT, THA  posterior precautions  DVT Prophylaxis: SCDx72 hrs, ASA 325 mg BID x 2 weeks  DISCHARGE PLAN: Home  DISCHARGE NEEDS: HHPT, HHRN, CPM, Walker and 3-in-1 comode seat

## 2014-04-14 LAB — CBC
HCT: 27.7 % — ABNORMAL LOW (ref 36.0–46.0)
HEMOGLOBIN: 8.8 g/dL — AB (ref 12.0–15.0)
MCH: 20.3 pg — ABNORMAL LOW (ref 26.0–34.0)
MCHC: 31.8 g/dL (ref 30.0–36.0)
MCV: 63.8 fL — ABNORMAL LOW (ref 78.0–100.0)
Platelets: 167 10*3/uL (ref 150–400)
RBC: 4.34 MIL/uL (ref 3.87–5.11)
RDW: 16 % — ABNORMAL HIGH (ref 11.5–15.5)
WBC: 8.9 10*3/uL (ref 4.0–10.5)

## 2014-04-14 NOTE — Progress Notes (Signed)
Occupational Therapy Treatment Patient Details Name: Jessica Patrick MRN: [id number] DOB: Apr 22, 1971 Today's Date: 04/14/2014    History of present illness Jessica Patrick is a 43 y.o. Female s/p Lt THA on 04/12/14.  Pt with hx of DJD and a previous knee scope.    OT comments  Pt seen today to address LB ADLs. Pt recently finished with PT and resting comfortably in bed. Pt observed to be ambulating at Supervision level with PT, and declined OOB with OT. Education and training completed for AE (hip kit and leg lifter) for increased independence with bed mobility and LB ADLs.    Follow Up Recommendations  Home health OT;Supervision/Assistance - 24 hour    Equipment Recommendations  None recommended by OT       Precautions / Restrictions Precautions Precautions: Posterior Hip Precaution Comments: educated pt on incorporating hip precautions into ADLs Restrictions Weight Bearing Restrictions: Yes LLE Weight Bearing: Weight bearing as tolerated       Mobility Bed Mobility Overal bed mobility: Needs Assistance Bed Mobility: Sit to Supine;Supine to Sit     Supine to sit: Min assist;HOB elevated Sit to supine: Min assist   General bed mobility comments: Pt used leg lifter to move Lt LE to EOB and back with Supervision.   Transfers Overall transfer level: Needs assistance Equipment used: Rolling walker (2 wheeled) Transfers: Sit to/from Stand Sit to Stand: Supervision         General transfer comment: Pt recently ambulated with PT and declined OOB activities.     Balance Overall balance assessment: Needs assistance Sitting-balance support: Feet supported;No upper extremity supported Sitting balance-Leahy Scale: Good     Standing balance support: During functional activity;Bilateral upper extremity supported;Single extremity supported Standing balance-Leahy Scale: Poor Standing balance comment: required UE support to perform pericare while standing at toilet; was able to stand  at sink without UE support but was bracing forearms on sink                   ADL Overall ADL's : Needs assistance/impaired     Grooming: Supervision/safety;Standing       Lower Body Bathing: Minimal assistance;With adaptive equipment;Sit to/from stand       Lower Body Dressing: Minimal assistance;With adaptive equipment;Sit to/from stand   Toilet Transfer: Minimal assistance;Ambulation;RW;BSC   Toileting- Clothing Manipulation and Hygiene: Minimal assistance;Sit to/from stand         General ADL Comments: Made visit with pt following PT session and observed pt ambulating in hallway during PT. Pt resting in bed and educated on AE for bed mobility and LB ADLs. Pt with return demo of use of leg lifter for bed mobility and hip kit for LB ADLs. Pt and family plan to purchase AE from gift shop this evening.                 Cognition  Arousal/Alertness: Awake/Alert Behavior During Therapy: WFL for tasks assessed/performed Overall Cognitive Status: Within Functional Limits for tasks assessed       Memory: Decreased recall of precautions                            Pertinent Vitals/ Pain       Pain Assessment: 0-10 Pain Score: 5  Pain Location: surgical site Pain Descriptors / Indicators: Aching;Discomfort Pain Intervention(s): Repositioned         Frequency Min 2X/week     Progress Toward Goals  OT Goals(current goals can  now be found in the care plan section)     Acute Rehab OT Goals Patient Stated Goal: to be able to cook and help out around the house  Plan Discharge plan remains appropriate       End of Session Equipment Utilized During Treatment: Other (comment) (AE (hip kit and leg lifter))   Activity Tolerance Patient tolerated treatment well   Patient Left in bed;with call bell/phone within reach;with family/visitor present   Nurse Communication Other (comment) (AE in room for pt to practice over night)        Time:  4660-5637 OT Time Calculation (min): 16 min  Charges: OT General Charges $OT Visit: 1 Procedure OT Treatments $Self Care/Home Management : 8-22 mins  Juluis Rainier [phone] 04/14/2014, 5:49 PM

## 2014-04-14 NOTE — Discharge Planning (Signed)
Spoke to patient concerning DME needs and patient advised me that she already has a walker at home and doesn't need commode.

## 2014-04-14 NOTE — Progress Notes (Signed)
Patient using IS, explained the purpose behind using the device to prevent pna, patient verbalizes and demonstrates understanding, 2500 on device, unproductive cough, continuing to monitor

## 2014-04-14 NOTE — Progress Notes (Signed)
Utilization review completed. Ellamarie Naeve, RN, BSN. 

## 2014-04-14 NOTE — Progress Notes (Signed)
Physical Therapy Treatment Patient Details Name: Jessica Patrick MRN: [id number] DOB: [birth year] Today's Date: 04/14/2014    History of Present Illness Makahla Kiser is a 43 y.o. Female s/p Lt THA on 04/12/14.  Pt with hx of DJD and a previous knee scope.     PT Comments    Pt slowly progressing mobility. Pt continues to be anxious at times and easily distractible. Requires multiple cues for posterior hip precautions. Will cont to follow per POC to progress mobility, thera ex and to reinforce posterior hip precautions.   Follow Up Recommendations  Home health PT;Supervision/Assistance - 24 hour;Other (comment)     Equipment Recommendations  Rolling walker with 5" wheels    Recommendations for Other Services       Precautions / Restrictions Precautions Precautions: Posterior Hip Precaution Comments: pt able to recall 1/3 hip precautions  Restrictions Weight Bearing Restrictions: Yes LLE Weight Bearing: Weight bearing as tolerated    Mobility  Bed Mobility Overal bed mobility: Needs Assistance Bed Mobility: Sit to Supine       Sit to supine: Min assist   General bed mobility comments: (A) to control Lt LE into bed and maintain hip precautions; relies heavily on handrails and trapeze bar  Transfers Overall transfer level: Needs assistance Equipment used: Rolling walker (2 wheeled) Transfers: Sit to/from Stand Sit to Stand: Supervision         General transfer comment: cues for hand placement and safety with transfers; to adhere to hip precautions; no physical (A) needed this session   Ambulation/Gait Ambulation/Gait assistance: Min guard Ambulation Distance (Feet): 150 Feet Assistive device: Rolling walker (2 wheeled) Gait Pattern/deviations: Step-through pattern;Decreased step length - left;Shuffle;Trunk flexed;Wide base of support Gait velocity: decreased Gait velocity interpretation: Below normal speed for age/gender General Gait Details: pt continued to have incr  difficulty clearing Lt LE; with increased mobility was able to begin stepping through; max cues for sequencing and upright posture; pt very slow gt speed and distractible in hallway    Stairs            Wheelchair Mobility    Modified Rankin (Stroke Patients Only)       Balance Overall balance assessment: Needs assistance Sitting-balance support: Feet supported;No upper extremity supported Sitting balance-Leahy Scale: Good     Standing balance support: During functional activity;Bilateral upper extremity supported Standing balance-Leahy Scale: Poor Standing balance comment: relies on RW for UE support                    Cognition Arousal/Alertness: Awake/alert Behavior During Therapy: Anxious Overall Cognitive Status: Within Functional Limits for tasks assessed       Memory: Decreased recall of precautions       Problem Solving: Slow processing      Exercises      General Comments General comments (skin integrity, edema, etc.): discussed importance of exercises; pt continues to state "i need to save my energy, i can do exercises on my own"       Pertinent Vitals/Pain Pain Assessment: 0-10 Pain Score: 6  Pain Location: surgical site Pain Descriptors / Indicators: Aching;Guarding Pain Intervention(s): Premedicated before session;Repositioned    Home Living                      Prior Function            PT Goals (current goals can now be found in the care plan section) Acute Rehab PT Goals Patient Stated Goal: to  get back caneless PT Goal Formulation: With patient Time For Goal Achievement: 04/17/14 Potential to Achieve Goals: Good Progress towards PT goals: Progressing toward goals    Frequency  7X/week    PT Plan Current plan remains appropriate    Co-evaluation             End of Session Equipment Utilized During Treatment: Gait belt Activity Tolerance: Patient tolerated treatment well Patient left: in bed;with call  bell/phone within reach     Time: 1032-1057 PT Time Calculation (min): 25 min  Charges:  Automatic Data$Gait Training: 23-37 mins                    G CodesDonell Sievert:      Keysean Savino N , [place]  [phone]  04/14/2014, 1:35 PM

## 2014-04-14 NOTE — Progress Notes (Signed)
Patient alert and oriented, ambulating to bathroom, worked with pt x2, family at bedside, no c/o pain, tylenol given x1 after working with PT, temp spiked to 101.3, Energy Transfer [employer]Eric PA notified, stated for patient to increase usage of IS, next 2 hours recheck temp, if persists notify on call MD/ PA. Patient is nonsymptomatic, continuing to monitor

## 2014-04-14 NOTE — Progress Notes (Signed)
Physical Therapy Treatment Patient Details Name: Jessica Patrick MRN: [id number] DOB: [birth year] Today's Date: 04/14/2014    History of Present Illness Jessica Patrick is a 43 y.o. Female s/p Lt THA on 04/12/14.  Pt with hx of DJD and a previous knee scope.     PT Comments    Pt continues to progress mobility slowly. Requires cues throughout session for proper technique to adhere to hip precautions. Pt with incr difficulty for bed mobility. Educated on use of technique with draw sheet to use UEs to bring Lt LE into bed; pt limited by pain today. Patient needs to practice stairs next session prior to D/C home. Pt has 2 STE house; has chair lift for steps to 2nd floor where bedroom is.   Follow Up Recommendations  Home health PT;Supervision/Assistance - 24 hour     Equipment Recommendations  Rolling walker with 5" wheels    Recommendations for Other Services       Precautions / Restrictions Precautions Precautions: Posterior Hip Precaution Comments: required cues to adhere to hip precautions during session Restrictions Weight Bearing Restrictions: Yes LLE Weight Bearing: Weight bearing as tolerated    Mobility  Bed Mobility Overal bed mobility: Needs Assistance Bed Mobility: Sit to Supine;Supine to Sit     Supine to sit: Min assist;HOB elevated Sit to supine: Min assist   General bed mobility comments: (A) to advance Lt LE in/out of bed; educated pt on use of sheet to hook around foot for (A); cues for sequencing and heavy use of trapeze bar   Transfers Overall transfer level: Needs assistance Equipment used: Rolling walker (2 wheeled) Transfers: Sit to/from Stand Sit to Stand: Supervision         General transfer comment: cues for hand placement and safety to adhere to hip precautions   Ambulation/Gait Ambulation/Gait assistance: Supervision Ambulation Distance (Feet): 200 Feet Assistive device: Rolling walker (2 wheeled) Gait Pattern/deviations: Step-through  pattern;Decreased stride length;Shuffle;Wide base of support Gait velocity: decreased Gait velocity interpretation: Below normal speed for age/gender General Gait Details: pt relies heavily on RW for UE support to off weight Lt LE; cues for upright posture and gt sequencing; required 2 standing rest breaks due to fatigue    Stairs            Wheelchair Mobility    Modified Rankin (Stroke Patients Only)       Balance Overall balance assessment: Needs assistance Sitting-balance support: Feet supported;No upper extremity supported Sitting balance-Leahy Scale: Good     Standing balance support: During functional activity;Bilateral upper extremity supported;Single extremity supported Standing balance-Leahy Scale: Poor Standing balance comment: required UE support to perform pericare while standing at toilet; was able to stand at sink without UE support but was bracing forearms on sink                    Cognition Arousal/Alertness: Awake/alert Behavior During Therapy: WFL for tasks assessed/performed Overall Cognitive Status: Within Functional Limits for tasks assessed       Memory: Decreased recall of precautions       Problem Solving: Slow processing      Exercises      General Comments General comments (skin integrity, edema, etc.): educated pt and mother on proper technique for  car transfer due to concerns regarding the technique and hip precautions       Pertinent Vitals/Pain Pain Assessment: 0-10 Pain Score: 5  Pain Location: surgical site Pain Descriptors / Indicators: Aching;Discomfort Pain Intervention(s): Patient requesting pain meds-RN  notified;Repositioned    Home Living                      Prior Function            PT Goals (current goals can now be found in the care plan section) Acute Rehab PT Goals Patient Stated Goal: to be able to cook and help out around the house PT Goal Formulation: With patient Time For Goal  Achievement: 04/17/14 Potential to Achieve Goals: Good Progress towards PT goals: Progressing toward goals    Frequency  7X/week    PT Plan Current plan remains appropriate    Co-evaluation             End of Session Equipment Utilized During Treatment: Gait belt Activity Tolerance: Patient tolerated treatment well Patient left: in bed;with call bell/phone within reach;with family/visitor present     Time: [id number] PT Time Calculation (min): 36 min  Charges:  Automatic Data$Gait Training: 23-37 mins                    G CodesDonell Sievert:      Waymond Meador N, [place]  [phone] 04/14/2014, 4:31 PM

## 2014-04-14 NOTE — Progress Notes (Signed)
PATIENT ID: Jessica BellowDana Lusby  MRN: [id number]  DOB/AGE:  43/31/1972 / 43 y.o.  2 Days Post-Op Procedure(s) (LRB): TOTAL HIP ARTHROPLASTY (Left)    PROGRESS NOTE Subjective: Patient is alert, oriented, no Nausea, no Vomiting, yes passing gas, no Bowel Movement. Taking PO well, pt sitting up taking sips. Denies SOB, Chest or Calf Pain. Using Incentive Spirometer, PAS in place. Ambulate WBAT and pt walked 80 ft with therapy yesterday Patient reports pain as 6 on 0-10 scale  .    Objective: Vital signs in last 24 hours: Filed Vitals:   04/13/14 2000 04/13/14 2155 04/13/14 2258 04/14/14 0621  BP:  120/62  121/65  Pulse:  112  108  Temp:  101.3 F (38.5 C) 100.7 F (38.2 C) 98.9 F (37.2 C)  TempSrc:  Oral Oral Oral  Resp: 18 18  19   Height:      Weight:      SpO2: 99% 97%  97%      Intake/Output from previous day: I/O last 3 completed shifts: In: 2405 [I.V.:2405] Out: 1100 [Urine:1100]   Intake/Output this shift:     LABORATORY DATA:  Recent Labs  04/13/14 0501  WBC 10.6*  HGB 9.6*  HCT 30.1*  PLT 202  NA 142  K 4.4  CL 104  CO2 25  BUN 16  CREATININE 0.72  GLUCOSE 132*  CALCIUM 8.4    Examination: Neurologically intact Neurovascular intact Sensation intact distally Intact pulses distally Dorsiflexion/Plantar flexion intact Incision: dressing C/D/I and no drainage No cellulitis present Compartment soft} XR AP&Lat of hip shows well placed\fixed THA  Assessment:   2 Days Post-Op Procedure(s) (LRB): TOTAL HIP ARTHROPLASTY (Left) ADDITIONAL DIAGNOSIS:  obesity  Plan: PT/OT WBAT, THA  posterior precautions  DVT Prophylaxis: SCDx72 hrs, ASA 325 mg BID x 2 weeks  DISCHARGE PLAN: Home, likely tomorrow after therapy.  Pt has to practice stair training.  DISCHARGE NEEDS: HHPT, HHRN, Walker and 3-in-1 comode seat

## 2014-04-15 LAB — CBC
HCT: 26.5 % — ABNORMAL LOW (ref 36.0–46.0)
HEMOGLOBIN: 8.2 g/dL — AB (ref 12.0–15.0)
MCH: 20.2 pg — ABNORMAL LOW (ref 26.0–34.0)
MCHC: 30.9 g/dL (ref 30.0–36.0)
MCV: 65.3 fL — AB (ref 78.0–100.0)
Platelets: DECREASED 10*3/uL (ref 150–400)
RBC: 4.06 MIL/uL (ref 3.87–5.11)
RDW: 16.3 % — ABNORMAL HIGH (ref 11.5–15.5)
WBC: 7.5 10*3/uL (ref 4.0–10.5)

## 2014-04-15 NOTE — Progress Notes (Signed)
Pt ready for discharge to home accomp by mother.  Copy of discharge instructions given and reviewed, Rx given and explained.  [facility] to come out for home PT.  FU appts reviewed with pt.  Walker for home use, they already have a 3N1 at home.

## 2014-04-15 NOTE — Progress Notes (Signed)
Occupational Therapy Treatment Patient Details Name: Jessica Patrick MRN: [id number] DOB: 04/06/1971 Today's Date: 04/15/2014    History of present illness Jessica BellowDana Patrick is a 43 y.o. Female s/p Lt THA on 04/12/14.  Pt with hx of DJD and a previous knee scope.    OT comments  Pt seen today to address bed mobility and ADLs. Pt with increased mobility this date, however bed mobility continues to be a challenge. Pt used leg lifter with some assist required to return to bed. Pt would benefit from continued skilled OT to address bed mobility and LB ADLs.    Follow Up Recommendations  Home health OT;Supervision/Assistance - 24 hour    Equipment Recommendations  None recommended by OT       Precautions / Restrictions Precautions Precautions: Posterior Hip Precaution Comments: educated pt on incorporating hip precautions into ADLs Restrictions Weight Bearing Restrictions: Yes LLE Weight Bearing: Weight bearing as tolerated       Mobility Bed Mobility Overal bed mobility: Needs Assistance Bed Mobility: Sit to Supine;Supine to Sit     Supine to sit: Supervision;HOB elevated Sit to supine: Min assist   General bed mobility comments: Pt was able to use trapeze bar, leg lifter, and bed rail to move supine>sit with supervision and increased time. Pt required increased assist for Lt LE management onto bed and use of trapeze bar to return supine.   Transfers Overall transfer level: Needs assistance Equipment used: Rolling walker (2 wheeled) Transfers: Sit to/from Stand Sit to Stand: Supervision              Balance Overall balance assessment: Needs assistance Sitting-balance support: No upper extremity supported;Feet supported Sitting balance-Leahy Scale: Good     Standing balance support: Bilateral upper extremity supported;During functional activity Standing balance-Leahy Scale: Poor Standing balance comment: Pt is able to remove 1 hand from walker to manage doors and perform  grooming at sink.                   ADL Overall ADL's : Needs assistance/impaired     Grooming: Supervision/safety;Standing                   Toilet Transfer: Supervision/safety;Ambulation;RW (BSC over toilet)   Toileting- Clothing Manipulation and Hygiene: Supervision/safety;Sit to/from stand   Tub/ Shower Transfer: Walk-in shower;Supervision/safety;Ambulation;Rolling walker   Functional mobility during ADLs: Supervision/safety;Rolling walker General ADL Comments: Pt with significantly improved mobility this date. Pt overall supervision level for functional mobility including shower transfer. Pt utilized leg lifter for bed mobility and was able to progress LLE off EOB to sit. Bed mobility continues to be the biggest challenge. Pt reports her mother purchased AE for home use.                Cognition  Arousal/Alertness: Awake/Alert Behavior During Therapy: WFL for tasks assessed/performed Overall Cognitive Status: Within Functional Limits for tasks assessed                                    Pertinent Vitals/ Pain       Pain Assessment: 0-10 Pain Score: 6  Pain Location: post. hip/ surgical site Pain Descriptors / Indicators: Sore;Aching Pain Intervention(s): Monitored during session;Repositioned         Frequency Min 2X/week     Progress Toward Goals  OT Goals(current goals can now be found in the care plan section)  Progress towards OT goals: Progressing  toward goals     Plan Discharge plan remains appropriate       End of Session Equipment Utilized During Treatment: Other (comment);Rolling walker;Gait belt (leg lifter)   Activity Tolerance Patient tolerated treatment well   Patient Left in bed;with call bell/phone within reach   Nurse Communication Other (comment) (Rehab AE in room for pt to practice (leg lifter))        Time: 1610-9604 OT Time Calculation (min): 47 min  Charges: OT General Charges $OT Visit: 1  Procedure OT Treatments $Self Care/Home Management : 23-37 mins $Therapeutic Activity: 8-22 mins  Rae Lips [phone] 04/15/2014, 11:36 AM

## 2014-04-15 NOTE — Discharge Summary (Signed)
Patient ID: Jessica Patrick MRN: [id number] DOB/AGE: [birth year] 43 y.o.  Admit date: 04/12/2014 Discharge date: 04/15/2014  Admission Diagnoses:  Principal Problem:   Arthritis of left hip Active Problems:   Arthritis, hip   Discharge Diagnoses:  Same  Past Medical History  Diagnosis Date  . H/O headache   . Thalassemia      s/p eval hematology 01-2012)  . DJD (degenerative joint disease)     Surgeries: Procedure(s): TOTAL HIP ARTHROPLASTY on 04/12/2014   Consultants:    Discharged Condition: Improved  Hospital Course: Jessica Patrick is an 43 y.o. female who was admitted 04/12/2014 for operative treatment ofArthritis of left hip. Patient has severe unremitting pain that affects sleep, daily activities, and work/hobbies. After pre-op clearance the patient was taken to the operating room on 04/12/2014 and underwent  Procedure(s): TOTAL HIP ARTHROPLASTY.    Patient was given perioperative antibiotics: Anti-infectives   Start     Dose/Rate Route Frequency Ordered Stop   04/12/14 0500  ceFAZolin (ANCEF) IVPB 2 g/50 mL premix     2 g 100 mL/hr over 30 Minutes Intravenous On call to O.R. 04/11/14 1451 04/12/14 1530       Patient was given sequential compression devices, early ambulation, and chemoprophylaxis to prevent DVT.  Patient benefited maximally from hospital stay and there were no complications.    Recent vital signs: Patient Vitals for the past 24 hrs:  BP Temp Temp src Pulse Resp SpO2  04/15/14 0727 - - - - 18 -  04/15/14 0559 122/60 mmHg 99 F (37.2 C) Oral 96 18 97 %  04/14/14 2135 123/60 mmHg 100 F (37.8 C) Oral 96 19 96 %  04/14/14 2017 - 99.9 F (37.7 C) Oral - - -  04/14/14 1810 - 101.3 F (38.5 C) Oral - - -  04/14/14 1701 124/66 mmHg 100.6 F (38.1 C) Oral 112 18 95 %  04/14/14 1600 - - - - 18 95 %  04/14/14 1200 - - - - 19 97 %     Recent laboratory studies:  Recent Labs  04/13/14 0501 04/14/14 0635 04/15/14 0555  WBC 10.6* 8.9 7.5  HGB 9.6* 8.8*  8.2*  HCT 30.1* 27.7* 26.5*  PLT 202 167 PENDING  NA 142  --   --   K 4.4  --   --   CL 104  --   --   CO2 25  --   --   BUN 16  --   --   CREATININE 0.72  --   --   GLUCOSE 132*  --   --   CALCIUM 8.4  --   --      Discharge Medications:     Medication List         aspirin EC 325 MG tablet  Take 1 tablet (325 mg total) by mouth 2 (two) times daily.     FOLIC ACID PO  Take 1 tablet by mouth daily.     methocarbamol 500 MG tablet  Commonly known as:  ROBAXIN  Take 1 tablet (500 mg total) by mouth 2 (two) times daily with a meal.     multivitamin capsule  Take 1 capsule by mouth daily.     oxyCODONE-acetaminophen 5-325 MG per tablet  Commonly known as:  ROXICET  Take 1 tablet by mouth every 4 (four) hours as needed.        Diagnostic Studies: Dg Chest 2 View  04/06/2014   CLINICAL DATA:  Preop for left hip  total arthroplasty.  EXAM: CHEST  2 VIEW  COMPARISON:  None.  FINDINGS: The heart size is normal. The lungs are clear. The right hemidiaphragm is slightly elevated. Degenerative changes are noted throughout the thoracic spine with leftward curvature in the lower thoracic spine. The visualized soft tissues and bony thorax are otherwise unremarkable.  IMPRESSION: 1. No acute cardiopulmonary disease. 2. Degenerative change in curvature in the thoracolumbar spine.   Electronically Signed   By: Gennette Pachris  Mattern M.D.   On: 04/06/2014 14:39   Dg Pelvis Portable  04/12/2014   CLINICAL DATA:  Osteoarthritis of the left hip.  EXAM: PORTABLE PELVIS 1-2 VIEWS  COMPARISON:  None.  FINDINGS: AP views of the pelvis and left hip demonstrate that the acetabular and femoral components of the left hip prosthesis appear in excellent position in the AP projection. No fractures.  IMPRESSION: Satisfactory appearance of the left total hip prosthesis in the AP projection.   Electronically Signed   By: Geanie CooleyJim  Maxwell M.D.   On: 04/12/2014 18:52    Disposition: Final discharge disposition not  confirmed      Discharge Instructions   Call MD / Call 911    Complete by:  As directed   If you experience chest pain or shortness of breath, CALL 911 and be transported to the hospital emergency room.  If you develope a fever above 101 F, pus (white drainage) or increased drainage or redness at the wound, or calf pain, call your surgeon's office.     Constipation Prevention    Complete by:  As directed   Drink plenty of fluids.  Prune juice may be helpful.  You may use a stool softener, such as Colace (over the counter) 100 mg twice a day.  Use MiraLax (over the counter) for constipation as needed.     Diet - low sodium heart healthy    Complete by:  As directed      Increase activity slowly as tolerated    Complete by:  As directed            Follow-up Information   Follow up with Nestor LewandowskyOWAN,FRANK J, MD In 2 weeks.   Specialty:  Orthopedic Surgery   Contact information:   [address] [place] [address] [phone]       Follow up with [facility]. (Someone from [facility] will contact you concerning start date and time for physical therapy.)    Contact information:   [address] [place] [address] [phone]        Signed: Drema HalonIDA, Noa Galvao PAUL 04/15/2014, 8:06 AM

## 2014-04-15 NOTE — Progress Notes (Signed)
Physical Therapy Treatment Patient Details Name: Jessica Patrick MRN: [id number] DOB: [birth year] Today's Date: 04/15/2014    History of Present Illness Jessica Patrick is a 43 y.o. Female s/p Lt THA on 04/12/14.  Pt with hx of DJD and a previous knee scope.     PT Comments    Patient continues with significant pain limiting function.  Still requires MIN assist for bed mobility and stairs, patient has assistance from family at home, and adaptive equipment set up available.  Patient provided cues and education for alternate methods to complete functional tasks, not all recommendations accepted, however, patient is able to demonstrate ability to complete task without violating hip precautions.  Will benefit from continued therapy from hospital therapy or Home Health therapy pending DC time.  Follow Up Recommendations  Home health PT;Supervision/Assistance - 24 hour     Equipment Recommendations  Rolling walker with 5" wheels    Recommendations for Other Services       Precautions / Restrictions Precautions Precautions: Posterior Hip Precaution Comments: educated pt on incorporating hip precautions into ADLs Restrictions Weight Bearing Restrictions: Yes LLE Weight Bearing: Weight bearing as tolerated    Mobility  Bed Mobility Overal bed mobility: Needs Assistance Bed Mobility: Supine to Sit     Supine to sit: Min guard;HOB elevated Sit to supine: Min assist   General bed mobility comments: Used trapeze bar and bed rail,.  Transfers Overall transfer level: Needs assistance Equipment used: Rolling walker (2 wheeled) Transfers: Stand Pivot Transfers Sit to Stand: Supervision         General transfer comment: Verbal cues for technique, not all recommendations utilized.  Ambulation/Gait Ambulation/Gait assistance: Supervision Ambulation Distance (Feet): 200 Feet Assistive device: Rolling walker (2 wheeled) Gait Pattern/deviations: Step-through pattern;Decreased stride  length;Wide base of support;Trunk flexed Gait velocity: decreased Gait velocity interpretation: Below normal speed for age/gender General Gait Details: Increased use of RW for support. Standing rest breaks.   Stairs Stairs: Yes Stairs assistance: Min assist Stair Management: One rail Right;With walker;Step to pattern Number of Stairs: 2 General stair comments: Decreased return demonstration of descending technique, despite re-cueing.  Wheelchair Mobility    Modified Rankin (Stroke Patients Only)       Balance Overall balance assessment: Needs assistance Sitting-balance support: No upper extremity supported;Feet supported Sitting balance-Leahy Scale: Good     Standing balance support: Bilateral upper extremity supported Standing balance-Leahy Scale: Fair Standing balance comment: Static stand activity ok with single UE support, dynamic balance with 2 UE support.                    Cognition Arousal/Alertness: Awake/alert Behavior During Therapy: WFL for tasks assessed/performed Overall Cognitive Status: Within Functional Limits for tasks assessed     Current Attention Level: Selective         Problem Solving: Requires verbal cues;Slow processing      Exercises Total Joint Exercises Heel Slides: AAROM;Left;10 reps;Supine Hip ABduction/ADduction: AAROM;Left;10 reps;Supine    General Comments        Pertinent Vitals/Pain Pain Assessment: 0-10 Pain Score: 5  Pain Location: Surgical site   Pain Descriptors / Indicators: Aching;Sore Pain Intervention(s): Monitored during session    Home Living                      Prior Function            PT Goals (current goals can now be found in the care plan section) Acute Rehab PT Goals Patient Stated  Goal: to be able to cook and help out around the house Progress towards PT goals: Progressing toward goals    Frequency  7X/week    PT Plan Current plan remains appropriate    Co-evaluation              End of Session Equipment Utilized During Treatment: Gait belt Activity Tolerance: Patient tolerated treatment well Patient left: in bed;with family/visitor present     Time: 1330-1417 PT Time Calculation (min): 47 min  Charges:  $Gait Training: 23-37 mins $Therapeutic Activity: 8-22 mins                    G Codes:      Ramey Ketcherside L 04/15/2014, 2:32 PM

## 2014-11-14 IMAGING — CR DG PORTABLE PELVIS
2 series · 2 of 2 positions shown · non-contrast
Comparison: None.

CLINICAL DATA: Osteoarthritis of the left hip.

EXAM:
PORTABLE PELVIS 1-2 VIEWS

[ap]
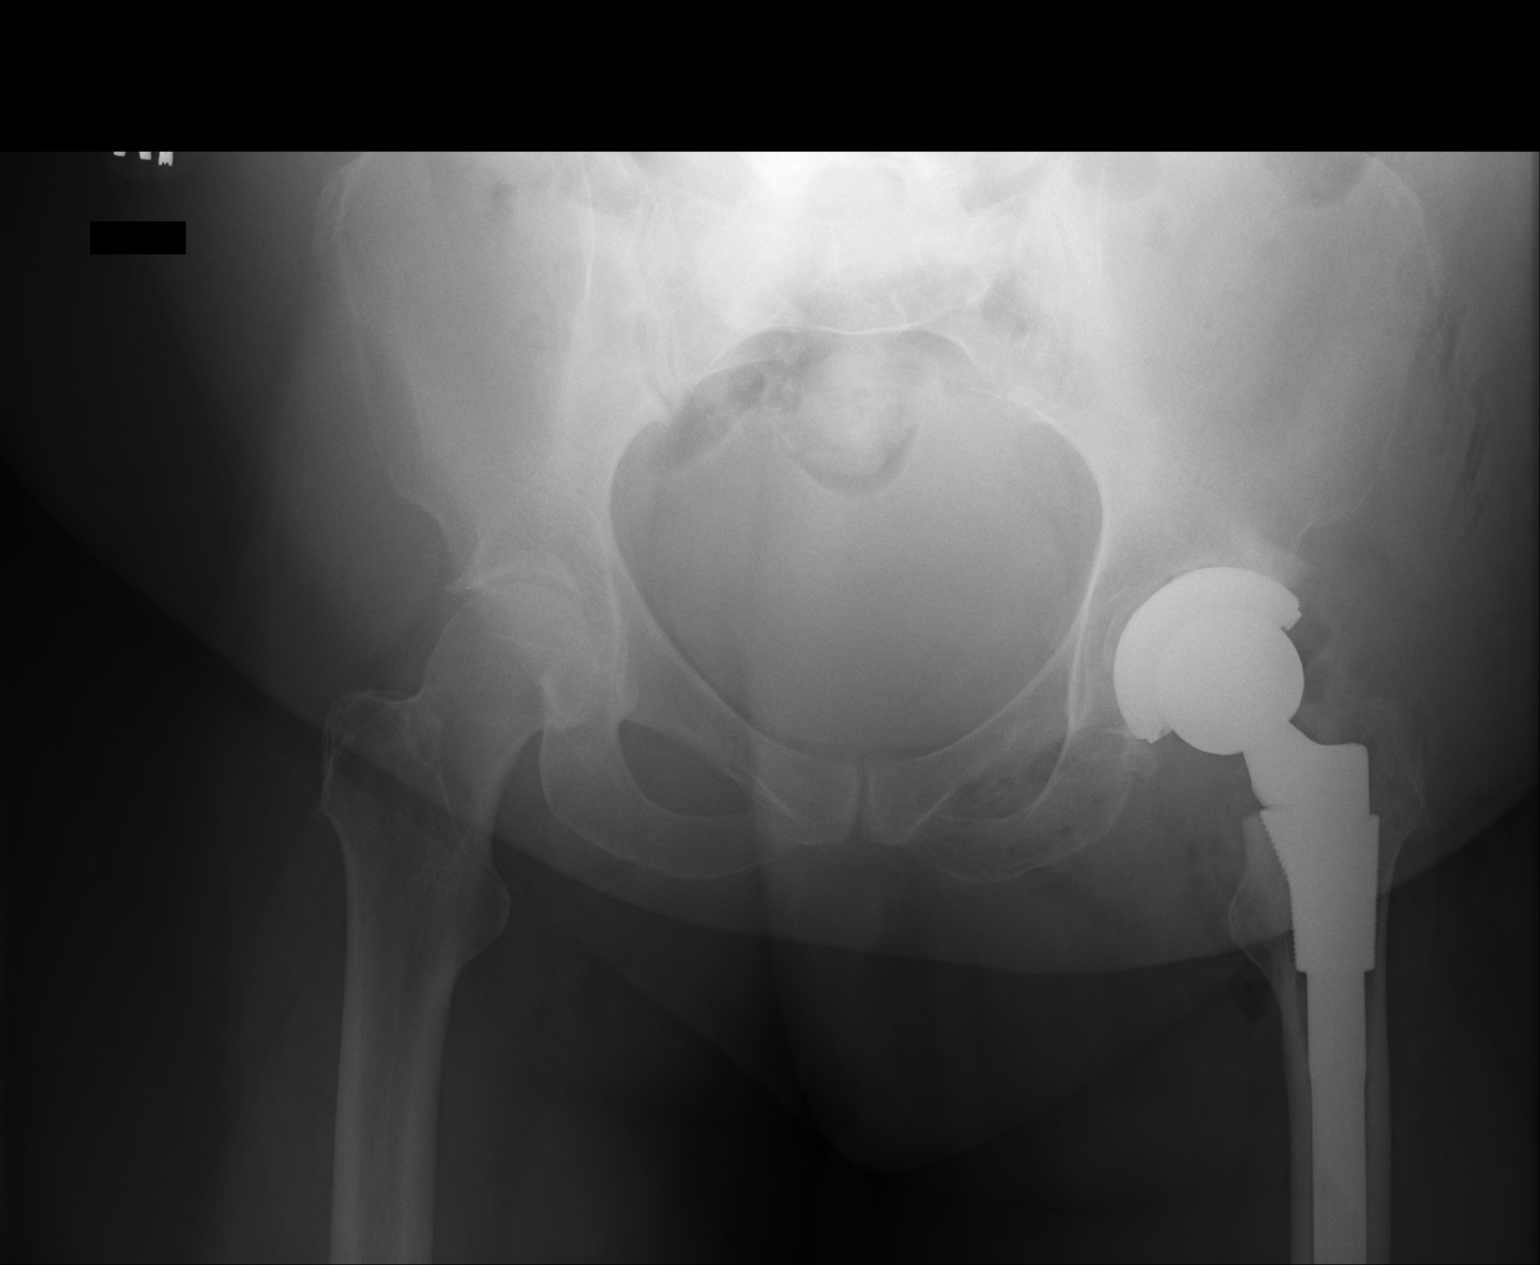

[inlet]
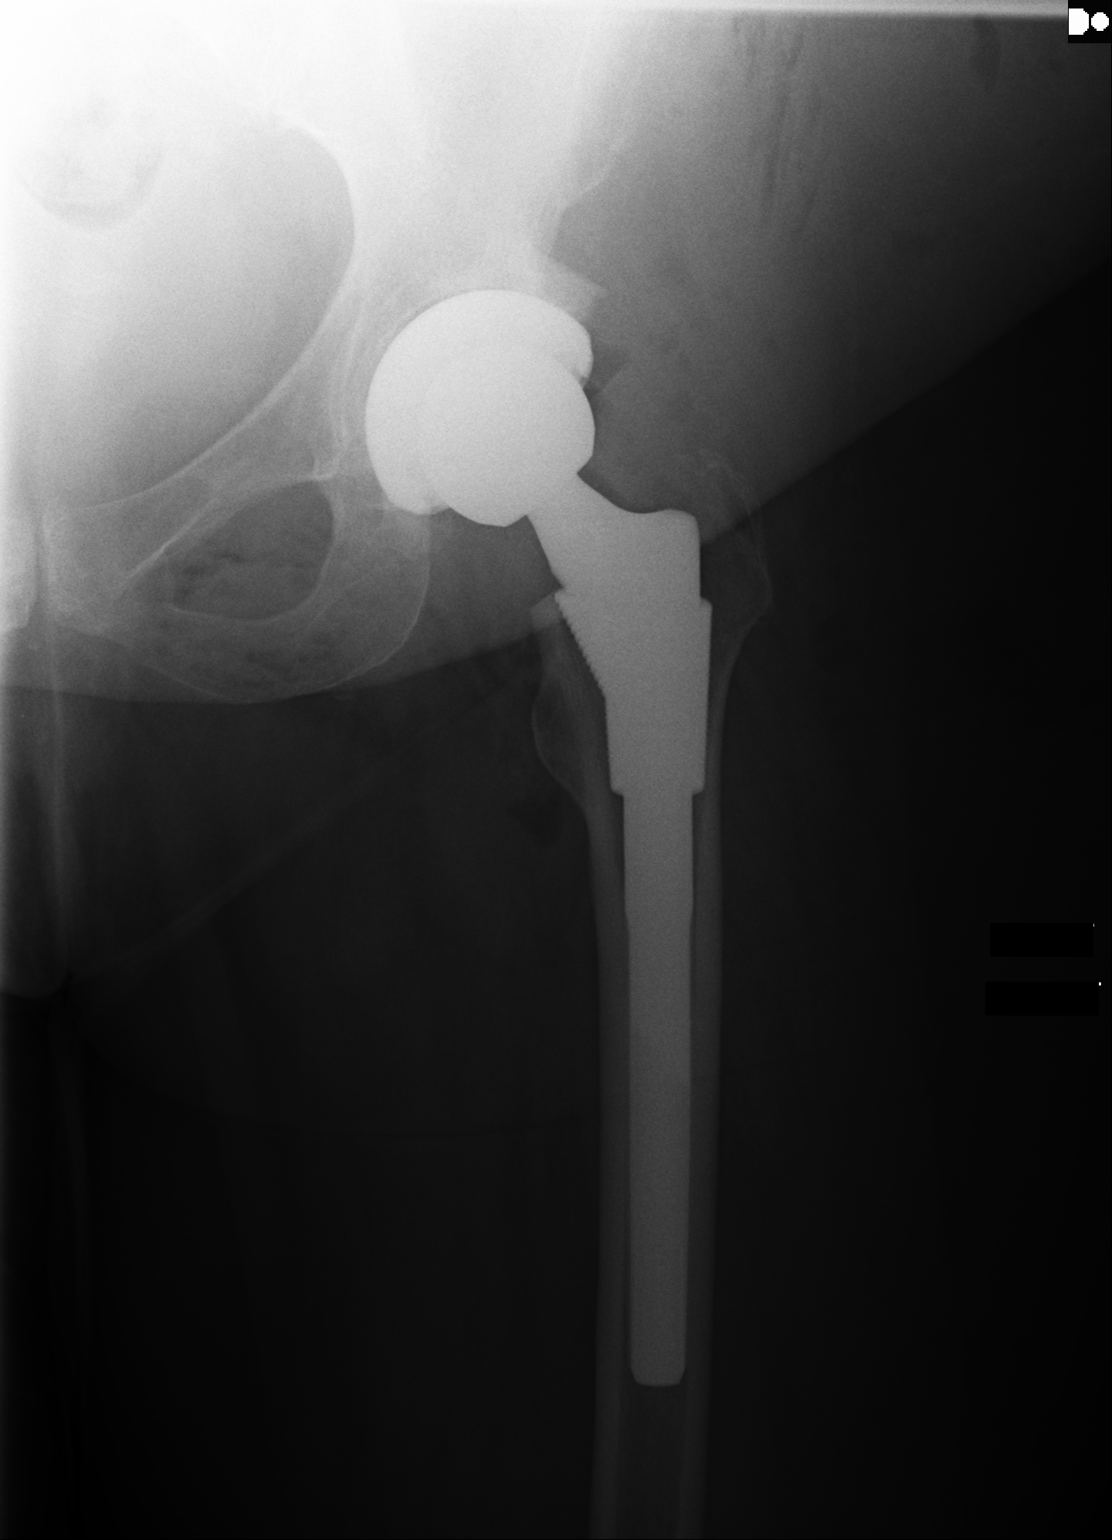

[2 of 2 positions shown; findings below may reference images not displayed]

FINDINGS: AP views of the pelvis and left hip demonstrate that the acetabular
and femoral components of the left hip prosthesis appear in
excellent position in the AP projection. No fractures.
IMPRESSION: Satisfactory appearance of the left total hip prosthesis in the AP
projection.

## 2015-01-17 ENCOUNTER — Encounter: Payer: Managed Care, Other (non HMO) | Admitting: Internal Medicine

## 2022-09-08 DEATH — deceased
